# Patient Record
Sex: Male | Born: 1950 | Race: White | Hispanic: No | Marital: Single | Smoking: Former smoker
Health system: Southern US, Community
[De-identification: ages and names within clinical notes are randomized; demographics above are authoritative.]

---

## 2014-10-01 ENCOUNTER — Encounter: Payer: Self-pay | Admitting: Podiatry

## 2014-10-01 ENCOUNTER — Ambulatory Visit (INDEPENDENT_AMBULATORY_CARE_PROVIDER_SITE_OTHER): Payer: Medicaid Other | Admitting: Podiatry

## 2014-10-01 VITALS — BP 167/75 | HR 65 | Ht 67.5 in | Wt 171.0 lb

## 2014-10-01 DIAGNOSIS — M79606 Pain in leg, unspecified: Secondary | ICD-10-CM | POA: Diagnosis not present

## 2014-10-01 DIAGNOSIS — B351 Tinea unguium: Secondary | ICD-10-CM | POA: Diagnosis not present

## 2014-10-01 NOTE — Progress Notes (Signed)
Subjective: 64 year old male presents accompanied by his sister complaining of painful toe nails. Patient stated that nails are too thick and need be trimming  Denies any other problems other than hypertension, one bad kidney and COPD.   Review of Systems - General ROS: negative Psychological ROS: Slow in communication. ENT ROS: negative Allergy and Immunology ROS: negative Hematological and Lymphatic ROS: negative Respiratory ROS: Has COPD and use inhaler. Cardiovascular ROS: Takes Plavix for clots in carotid artery. Gastrointestinal ROS: no abdominal pain, change in bowel habits, or black or bloody stools Genito-Urinary ROS: no dysuria, trouble voiding, or hematuria Musculoskeletal ROS: negative Neurological ROS: no TIA or stroke symptoms Dermatological ROS: negative.  Objective: Dermatologic: Hypertrophic and dystrophic nails x 10. Vascular: All pedal pulses are faintly palpable. Neurologic: All epicritic and tactile sensations are grossly intact. Orthopedic: No gross deformities noted.  Assessment: Onychomycosis x 10. Painful feet.  Plan: All nails debrided. Return in 3 months or as needed.

## 2014-10-01 NOTE — Patient Instructions (Signed)
Seen for hypertrophic nails. All nails debrided. Return in 3 months or as needed.  

## 2015-01-02 ENCOUNTER — Encounter: Payer: Self-pay | Admitting: Podiatry

## 2015-01-02 ENCOUNTER — Ambulatory Visit (INDEPENDENT_AMBULATORY_CARE_PROVIDER_SITE_OTHER): Payer: Medicaid Other | Admitting: Podiatry

## 2015-01-02 VITALS — BP 112/68 | HR 58

## 2015-01-02 DIAGNOSIS — B351 Tinea unguium: Secondary | ICD-10-CM | POA: Diagnosis not present

## 2015-01-02 DIAGNOSIS — M79606 Pain in leg, unspecified: Secondary | ICD-10-CM | POA: Diagnosis not present

## 2015-01-02 NOTE — Progress Notes (Signed)
Subjective: 64 year old male presents complaining of painful toe nails.   Objective: Dermatologic: Hypertrophic and dystrophic nails x 10. Vascular: All pedal pulses are faintly palpable. Neurologic: All epicritic and tactile sensations are grossly intact. Orthopedic: No gross deformities noted.  Assessment: Onychomycosis x 10. Painful feet.  Plan: All nails debrided.

## 2015-01-02 NOTE — Patient Instructions (Signed)
Seen for hypertrophic nails. All nails debrided. Return in 3 months or as needed.  

## 2015-03-31 ENCOUNTER — Ambulatory Visit (INDEPENDENT_AMBULATORY_CARE_PROVIDER_SITE_OTHER): Payer: Medicaid Other | Admitting: Podiatry

## 2015-03-31 ENCOUNTER — Encounter: Payer: Self-pay | Admitting: Podiatry

## 2015-03-31 VITALS — BP 145/96 | HR 60

## 2015-03-31 DIAGNOSIS — B351 Tinea unguium: Secondary | ICD-10-CM | POA: Diagnosis not present

## 2015-03-31 DIAGNOSIS — M79606 Pain in leg, unspecified: Secondary | ICD-10-CM | POA: Diagnosis not present

## 2015-03-31 MED ORDER — CARRINGTON MOISTURE BARRIER EX CREA: TOPICAL_CREAM | CUTANEOUS | Status: DC | PRN

## 2015-03-31 NOTE — Patient Instructions (Signed)
Seen for hypertrophic nails. All nails debrided. Return in 3 months or as needed.  

## 2015-03-31 NOTE — Progress Notes (Signed)
Subjective: 64 year old male presents complaining of painful toe nails.   Past medical history reveals Heart valve surgery, COPD (Emphysema), Coronary artery disease, small and poor functioning Kidney.  Objective: Dermatologic: Hypertrophic and dystrophic nails x 10. Severe dry flaky skin. Vascular: All pedal pulses are faintly palpable. Neurologic: All epicritic and tactile sensations are grossly intact. Orthopedic: Enlarged bunion bilateral.   Assessment: Onychomycosis x 10. Xerotic skin. Painful feet.  Plan: All nails debrided. Rx sent for Eucerin cream.  Return in 3 months or as needed.

## 2015-04-03 ENCOUNTER — Ambulatory Visit: Payer: Medicaid Other | Admitting: Podiatry

## 2015-07-02 ENCOUNTER — Ambulatory Visit: Payer: Medicaid Other | Admitting: Podiatry

## 2015-07-09 ENCOUNTER — Encounter: Payer: Self-pay | Admitting: Podiatry

## 2015-07-09 ENCOUNTER — Ambulatory Visit (INDEPENDENT_AMBULATORY_CARE_PROVIDER_SITE_OTHER): Payer: Medicaid Other | Admitting: Podiatry

## 2015-07-09 VITALS — BP 140/60 | HR 72

## 2015-07-09 DIAGNOSIS — M79606 Pain in leg, unspecified: Secondary | ICD-10-CM | POA: Diagnosis not present

## 2015-07-09 DIAGNOSIS — B351 Tinea unguium: Secondary | ICD-10-CM

## 2015-07-09 NOTE — Patient Instructions (Signed)
Seen for hypertrophic nails. All nails debrided. Return in 3 months or as needed.  

## 2015-07-09 NOTE — Progress Notes (Signed)
Subjective: 65 year old male presents complaining of painful feet. Has pain under the big joint and toe nails.   Objective: Dermatologic: Hypertrophic and dystrophic nails x 10. Thick painful calluses under the first MPJ bilateral. Severe dry flaky skin. Vascular: All pedal pulses are faintly palpable. Neurologic: All epicritic and tactile sensations are grossly intact. Orthopedic: Enlarged bunion bilateral.   Assessment: Onychomycosis x 10. Xerotic skin. Painful feet.  Plan: All nails debrided. Return in 3 months or as needed

## 2015-10-06 ENCOUNTER — Ambulatory Visit: Payer: Medicaid Other | Admitting: Podiatry

## 2015-10-08 ENCOUNTER — Ambulatory Visit (INDEPENDENT_AMBULATORY_CARE_PROVIDER_SITE_OTHER): Payer: Medicare Other | Admitting: Podiatry

## 2015-10-08 ENCOUNTER — Encounter: Payer: Self-pay | Admitting: Podiatry

## 2015-10-08 VITALS — BP 135/70 | HR 60

## 2015-10-08 DIAGNOSIS — B351 Tinea unguium: Secondary | ICD-10-CM | POA: Diagnosis not present

## 2015-10-08 DIAGNOSIS — M2040 Other hammer toe(s) (acquired), unspecified foot: Secondary | ICD-10-CM | POA: Diagnosis not present

## 2015-10-08 DIAGNOSIS — Q828 Other specified congenital malformations of skin: Secondary | ICD-10-CM | POA: Diagnosis not present

## 2015-10-08 NOTE — Progress Notes (Signed)
Subjective: 65 year old male presents complaining of painful feet for thick calluses and toe nails.  Has pain under the ball of left foott and toe nails.   Objective: Dermatologic: Hypertrophic and dystrophic nails x 10. Thick painful calluses under the first MPJ bilateral. Digital corn at distal end 3rd and 4th digit left. Vascular: All pedal pulses are faintly palpable. Neurologic: All epicritic and tactile sensations are grossly intact. Orthopedic: Enlarged bunion bilateral. Contracted lesser digits with digital corn 3rd and 4th left.   Assessment: Onychomycosis x 10. Painful corns and calluses bilateral.  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  Painful feet.  Plan: All nails. Corns, and calluses debrided. Return in 3 months or as needed

## 2015-10-08 NOTE — Patient Instructions (Signed)
Seen for hypertrophic nails. All nails debrided. Return in 3 months or as needed.  

## 2015-12-10 ENCOUNTER — Other Ambulatory Visit (HOSPITAL_COMMUNITY): Payer: Self-pay | Admitting: Gastroenterology

## 2016-01-07 ENCOUNTER — Ambulatory Visit: Payer: Medicare Other | Admitting: Podiatry

## 2016-01-13 ENCOUNTER — Encounter: Payer: Self-pay | Admitting: Podiatry

## 2016-01-13 ENCOUNTER — Ambulatory Visit (INDEPENDENT_AMBULATORY_CARE_PROVIDER_SITE_OTHER): Payer: Medicare Other | Admitting: Podiatry

## 2016-01-13 DIAGNOSIS — M79673 Pain in unspecified foot: Secondary | ICD-10-CM | POA: Diagnosis not present

## 2016-01-13 DIAGNOSIS — S97102A Crushing injury of unspecified left toe(s), initial encounter: Secondary | ICD-10-CM | POA: Diagnosis not present

## 2016-01-13 DIAGNOSIS — M79606 Pain in leg, unspecified: Secondary | ICD-10-CM

## 2016-01-13 NOTE — Patient Instructions (Signed)
Seen for hypertrophic nails. All nails debrided. Keep band aid on injured toe 5th left till healed. Return in 3 months or as needed.

## 2016-01-13 NOTE — Progress Notes (Signed)
Subjective: 65 year old male presents complaining of painful feet for thick calluses and toe nails.  Bumped left foot and skin came off the little toe.  Objective: Dermatologic: Skin abrasion distal medial just proximal to nail plate. No drainage. No associated cellulitis noted. Hypertrophic and dystrophic nails x 10. Thick painful calluses under the first MPJ bilateral. Digital corn at distal end 3rd and 4th digit left. Vascular: All pedal pulses are faintly palpable. Neurologic: All epicritic and tactile sensations are grossly intact. Orthopedic: Enlarged bunion bilateral. Contracted lesser digits with digital corn 3rd and 4th left.   Assessment: Injured toe 5th left with skin abrasion.  Onychomycosis x 10. Painful corns and calluses bilateral.  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  Painful feet.  Plan: All nails. Corns, and calluses debrided. Amerigel ointment dressing applied 5th toe left. Patient is to keep a band aid till the lesion is healed.  Return in 3 months or as needed

## 2016-01-14 ENCOUNTER — Ambulatory Visit: Payer: Medicare Other | Admitting: Podiatry

## 2016-01-21 ENCOUNTER — Ambulatory Visit (HOSPITAL_COMMUNITY): Admission: RE | Admit: 2016-01-21 | Payer: Medicare Other | Source: Ambulatory Visit

## 2016-03-10 ENCOUNTER — Ambulatory Visit (HOSPITAL_COMMUNITY)
Admission: RE | Admit: 2016-03-10 | Discharge: 2016-03-10 | Disposition: A | Discharge status: Home / Self Care | Payer: Medicare Other | Source: Ambulatory Visit | Attending: Gastroenterology | Admitting: Gastroenterology

## 2016-03-10 DIAGNOSIS — N261 Atrophy of kidney (terminal): Secondary | ICD-10-CM | POA: Diagnosis not present

## 2016-04-21 ENCOUNTER — Encounter: Payer: Self-pay | Admitting: Podiatry

## 2016-04-21 ENCOUNTER — Ambulatory Visit (INDEPENDENT_AMBULATORY_CARE_PROVIDER_SITE_OTHER): Payer: Medicare Other | Admitting: Podiatry

## 2016-04-21 VITALS — BP 163/82 | HR 62

## 2016-04-21 DIAGNOSIS — B351 Tinea unguium: Secondary | ICD-10-CM

## 2016-04-21 DIAGNOSIS — M79672 Pain in left foot: Secondary | ICD-10-CM | POA: Diagnosis not present

## 2016-04-21 DIAGNOSIS — M79671 Pain in right foot: Secondary | ICD-10-CM

## 2016-04-21 NOTE — Patient Instructions (Signed)
Seen for hypertrophic nails. All nails debrided. Return in 3 months or as needed.  

## 2016-04-21 NOTE — Progress Notes (Signed)
Subjective: 65 year old male presents complaining of painful feet from calluses and toe nails.   Objective: Dermatologic:Hypertrophic and dystrophic nails x 10. Thick painful calluses under the first MPJ bilateral. Digital corn at distal end 3rd and 4th digit left. Vascular: All pedal pulses are faintly palpable. Neurologic: All epicritic and tactile sensations are grossly intact. Orthopedic: Enlarged bunion bilateral. Contracted lesser digits with digital corn 3rd and 4th left.   Assessment: Onychomycosis x 10. Painful corns and calluses bilateral.  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  Painful feet.  Plan: All nails. Corns, and calluses debrided. Return in 3 months or as needed

## 2016-07-21 ENCOUNTER — Ambulatory Visit: Payer: Medicare Other | Admitting: Podiatry

## 2016-07-28 ENCOUNTER — Ambulatory Visit: Payer: Medicare Other | Admitting: Podiatry

## 2016-08-11 ENCOUNTER — Ambulatory Visit (INDEPENDENT_AMBULATORY_CARE_PROVIDER_SITE_OTHER): Payer: Medicare Other | Admitting: Podiatry

## 2016-08-11 DIAGNOSIS — M79671 Pain in right foot: Secondary | ICD-10-CM | POA: Diagnosis not present

## 2016-08-11 DIAGNOSIS — M79672 Pain in left foot: Secondary | ICD-10-CM

## 2016-08-11 DIAGNOSIS — B351 Tinea unguium: Secondary | ICD-10-CM | POA: Diagnosis not present

## 2016-08-11 NOTE — Patient Instructions (Signed)
Seen for hypertrophic nails. All nails debrided. Return in 3 months or as needed.  

## 2016-08-11 NOTE — Progress Notes (Signed)
Subjective: 66 year old male presents complaining of painful feet from calluses and toe nails.   Objective: Dermatologic:Hypertrophic and dystrophic nails x 10. Thick painful calluses under the first MPJ bilateral. Digital corn at distal end 3rd and 4th digit left. Vascular: All pedal pulses are faintly palpable. Neurologic: All epicritic and tactile sensations are grossly intact. Orthopedic: Enlarged bunion bilateral. Contracted lesser digits with digital corn 3rd and 4th left.   Assessment: Onychomycosis x 10. Painful corns and calluses bilateral.  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  Painful feet.  Plan: All nails. Corns, and calluses debrided. Return in 3 months or as needed

## 2016-08-12 ENCOUNTER — Encounter: Payer: Self-pay | Admitting: Podiatry

## 2016-11-10 ENCOUNTER — Ambulatory Visit (INDEPENDENT_AMBULATORY_CARE_PROVIDER_SITE_OTHER): Payer: Medicare Other | Admitting: Podiatry

## 2016-11-10 ENCOUNTER — Encounter: Payer: Self-pay | Admitting: Podiatry

## 2016-11-10 VITALS — BP 135/65 | HR 61

## 2016-11-10 DIAGNOSIS — M79672 Pain in left foot: Secondary | ICD-10-CM | POA: Diagnosis not present

## 2016-11-10 DIAGNOSIS — M79671 Pain in right foot: Secondary | ICD-10-CM

## 2016-11-10 DIAGNOSIS — B351 Tinea unguium: Secondary | ICD-10-CM

## 2016-11-10 NOTE — Patient Instructions (Signed)
Seen for hypertrophic nails. All nails debrided. Return in 3 months or as needed.  

## 2016-11-10 NOTE — Progress Notes (Signed)
Subjective: 66 year old male presents requesting painful calluses and toe nails trimmed.   Objective: Dermatologic:Hypertrophic and dystrophic nails x 10. Thick painful calluses under the first MPJ bilateral. Digital corn at distal end 3rd and 4th digit left. Vascular: All pedal pulses are faintly palpable. Neurologic: All epicritic and tactile sensations are grossly intact. Orthopedic: Enlarged bunion bilateral. Contracted lesser digits with digital corn 3rd and 4th left.   Assessment: Onychomycosis x 10. Painful corns and calluses bilateral.  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  Painful feet.  Plan: All nails. Corns, and calluses debrided. Return in 3 months or as needed

## 2017-02-07 ENCOUNTER — Ambulatory Visit (INDEPENDENT_AMBULATORY_CARE_PROVIDER_SITE_OTHER): Payer: Medicare Other | Admitting: Podiatry

## 2017-02-07 ENCOUNTER — Encounter: Payer: Self-pay | Admitting: Podiatry

## 2017-02-07 DIAGNOSIS — B351 Tinea unguium: Secondary | ICD-10-CM | POA: Diagnosis not present

## 2017-02-07 DIAGNOSIS — M79604 Pain in right leg: Secondary | ICD-10-CM | POA: Diagnosis not present

## 2017-02-07 DIAGNOSIS — M79605 Pain in left leg: Secondary | ICD-10-CM

## 2017-02-07 NOTE — Patient Instructions (Signed)
Seen for hypertrophic nails. All nails debrided. Return in 3 months or as needed.  

## 2017-02-07 NOTE — Progress Notes (Signed)
Subjective: 66year old male presents complaining of painful nails and requesting toe nails trimmed.   Objective: Dermatologic:Hypertrophic and dystrophic nails x 10. No abnormal skin lesions noted. Vascular: All pedal pulses are faintly palpable. Neurologic: All epicritic and tactile sensations are grossly intact. Orthopedic: Enlarged bunion bilateral. Contracted lesser digits with digital corn 3rd and 4th left.   Assessment: Onychomycosis x 10. 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  Painful feet.  Plan: All nails debrided. Return as needed

## 2017-02-09 ENCOUNTER — Ambulatory Visit: Payer: Medicare Other | Admitting: Podiatry

## 2017-05-04 ENCOUNTER — Ambulatory Visit: Payer: Medicare Other | Admitting: Podiatry

## 2017-05-04 ENCOUNTER — Ambulatory Visit (INDEPENDENT_AMBULATORY_CARE_PROVIDER_SITE_OTHER): Payer: Medicare Other | Admitting: Podiatry

## 2017-05-04 DIAGNOSIS — M79605 Pain in left leg: Secondary | ICD-10-CM

## 2017-05-04 DIAGNOSIS — Q828 Other specified congenital malformations of skin: Secondary | ICD-10-CM | POA: Diagnosis not present

## 2017-05-04 DIAGNOSIS — M2040 Other hammer toe(s) (acquired), unspecified foot: Secondary | ICD-10-CM | POA: Diagnosis not present

## 2017-05-04 DIAGNOSIS — B351 Tinea unguium: Secondary | ICD-10-CM

## 2017-05-04 DIAGNOSIS — M79604 Pain in right leg: Secondary | ICD-10-CM | POA: Diagnosis not present

## 2017-05-04 NOTE — Patient Instructions (Signed)
Seen for pre ulcerative callus and hypertrophic nails. All callused lesions and nails debrided. Return in 3 months or as needed.

## 2017-05-05 ENCOUNTER — Encounter: Payer: Self-pay | Admitting: Podiatry

## 2017-05-05 NOTE — Progress Notes (Signed)
Subjective: 66 y.o. year old male patient presents accompanied by his wife complaining of painful feet. Stated that he was seen at PCP office and was advised to have calluses trimmed ASAP for they appear to be pre ulcerative.  Objective: Dermatologic: Thick yellow deformed nails x 10. Severe plantar callus under the first MPJ bilateral L>R. Mild intradermal noted. No open skin lesions or associated edema or erythema noted. Distal clivi under 3rd and 4th digit left. Vascular: Pedal pulses are not palpable on both DP and PT.  Orthopedic: Severe bunion deformity with contracted lesser diits 3rd and 4th left. Neurologic: All epicritic and tactile sensations grossly intact.  Assessment: Dystrophic mycotic nails x 10. Pre ulcerative callus sub first MPJ bilateral L>R. Hammer toe deformity 3rd and 4th with digital callus left. Peripheral vascular insufficiency.  Pain in both feet.  Treatment: All mycotic nails, corns, calluses debrided.  Return in 3 months or sooner if feet become painful.

## 2017-05-11 ENCOUNTER — Ambulatory Visit: Payer: Medicare Other | Admitting: Podiatry

## 2017-08-03 ENCOUNTER — Ambulatory Visit (INDEPENDENT_AMBULATORY_CARE_PROVIDER_SITE_OTHER): Payer: Medicare Other | Admitting: Podiatry

## 2017-08-03 ENCOUNTER — Encounter: Payer: Self-pay | Admitting: Podiatry

## 2017-08-03 DIAGNOSIS — Q828 Other specified congenital malformations of skin: Secondary | ICD-10-CM

## 2017-08-03 DIAGNOSIS — M79605 Pain in left leg: Secondary | ICD-10-CM

## 2017-08-03 DIAGNOSIS — M79672 Pain in left foot: Secondary | ICD-10-CM

## 2017-08-03 DIAGNOSIS — B351 Tinea unguium: Secondary | ICD-10-CM

## 2017-08-03 DIAGNOSIS — M79604 Pain in right leg: Secondary | ICD-10-CM

## 2017-08-03 DIAGNOSIS — I739 Peripheral vascular disease, unspecified: Secondary | ICD-10-CM | POA: Diagnosis not present

## 2017-08-03 DIAGNOSIS — M79671 Pain in right foot: Secondary | ICD-10-CM

## 2017-08-03 NOTE — Progress Notes (Signed)
Subjective: 67 y.o. year old male patient presents accompanied by his sister complaining of painful nails. Patient requests toe nails, corns and calluses trimmed.   Objective: Dermatologic: Thick yellow deformed nails x 10. Plantar calluses under 1st MPJ bilateral. Vascular: Pedal pulses are notl palpable both DP and PT. Orthopedic: Contracted lesser digits 3rd and 4th left with bunion deformity. Neurologic: All epicritic and tactile sensations grossly intact.  Assessment: Dystrophic mycotic nails x 10. Plantar callus under first MPJ bilateral. PVD.  Treatment: All mycotic nails, corns, calluses debrided.  Return in 3 months or as needed.

## 2017-08-03 NOTE — Patient Instructions (Signed)
Seen for hypertrophic nails and calluses. All nails and calluses debrided. Return in 3 months or as needed.  

## 2017-08-06 IMAGING — US ABDOMEN
1 series · 13 of 15 positions shown · non-contrast
Comparison: None.

CLINICAL DATA: Patient with history of hemochromatosis.



[Series 1: abdomen · 0.15mm/px · 13 of 15 slices shown]
[im 1/15]
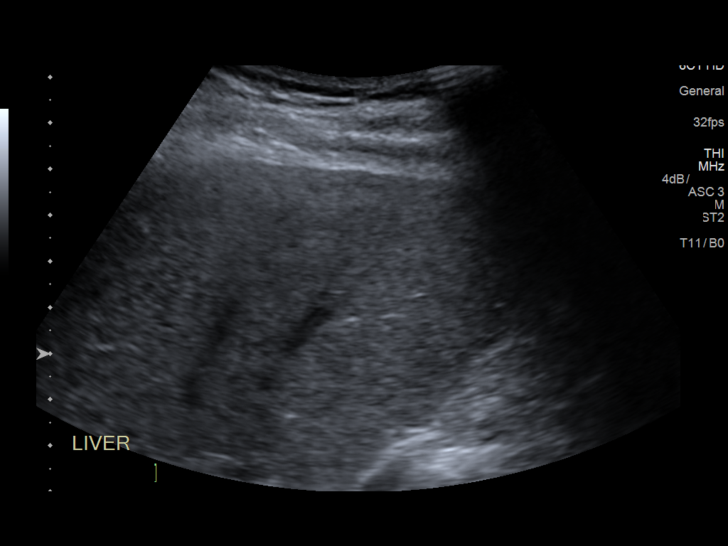
[im 2/15]
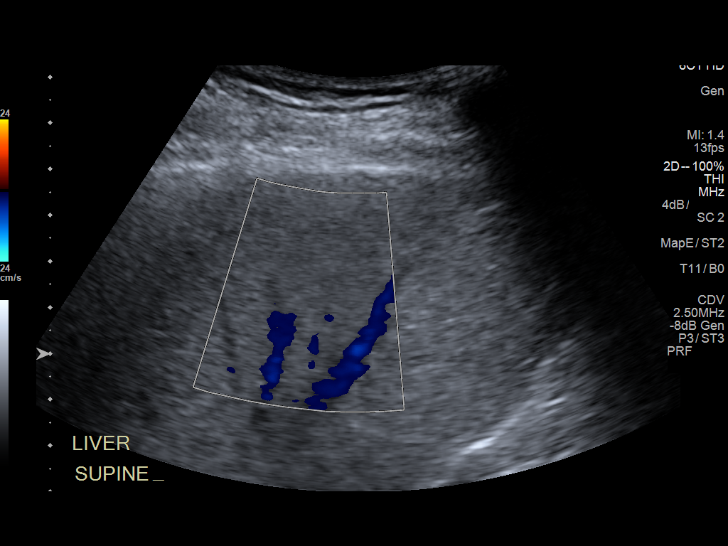
[im 3/15]
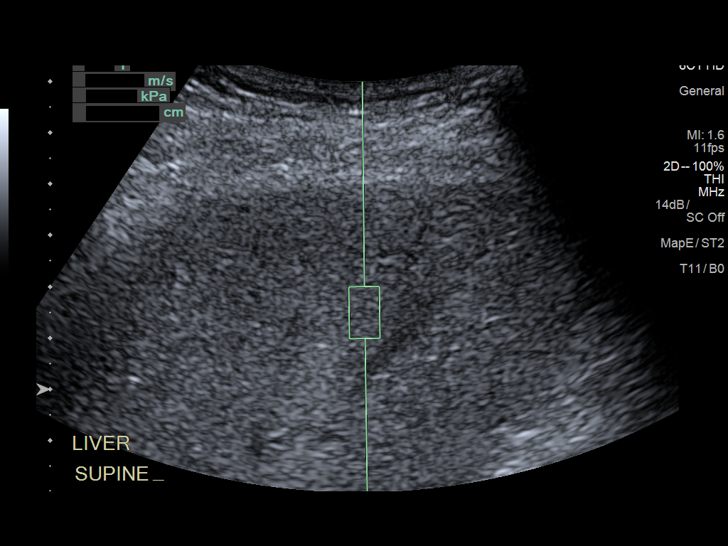
[im 5/15]
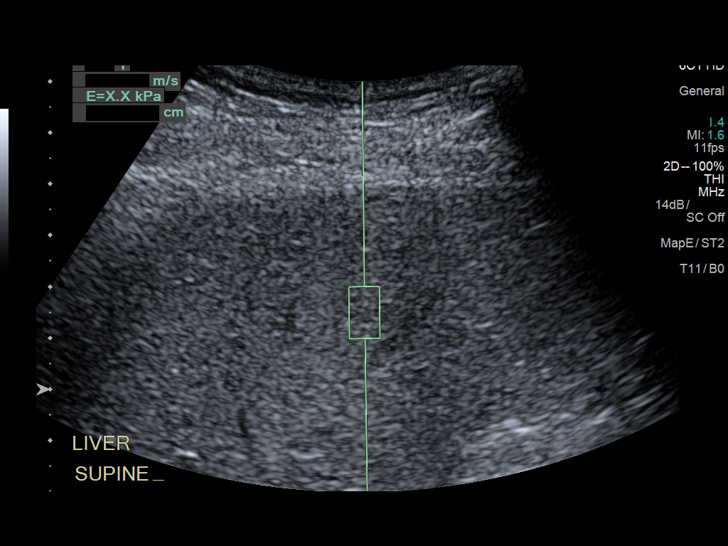
[im 6/15]
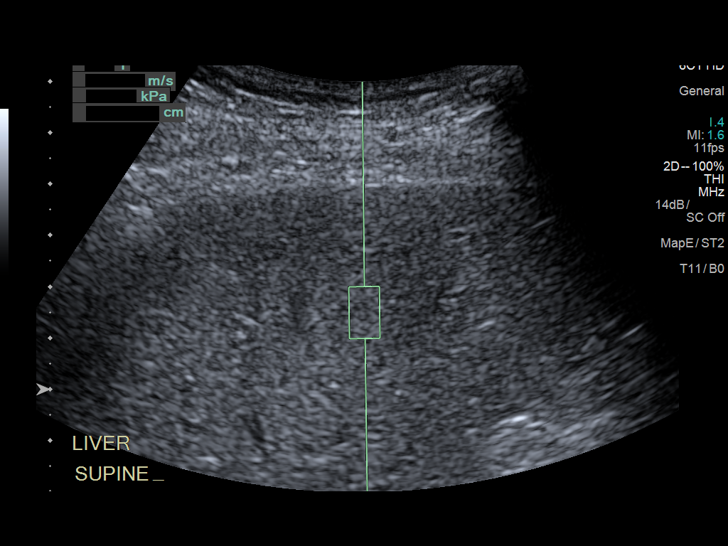
[im 7/15]
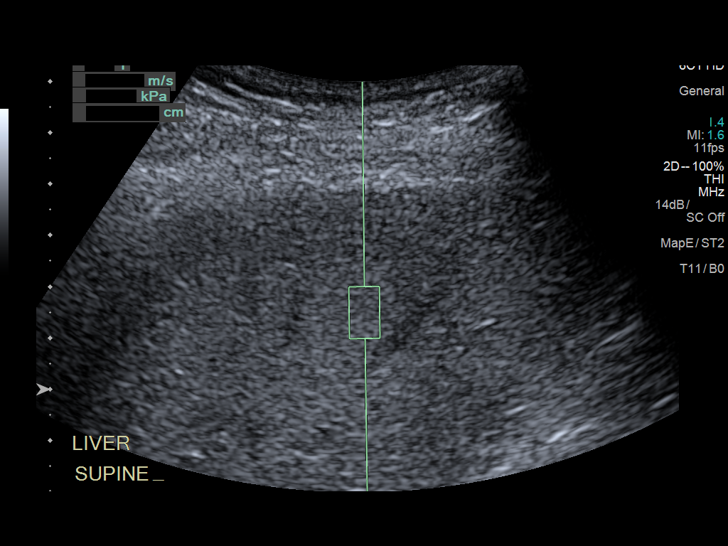
[im 8/15]
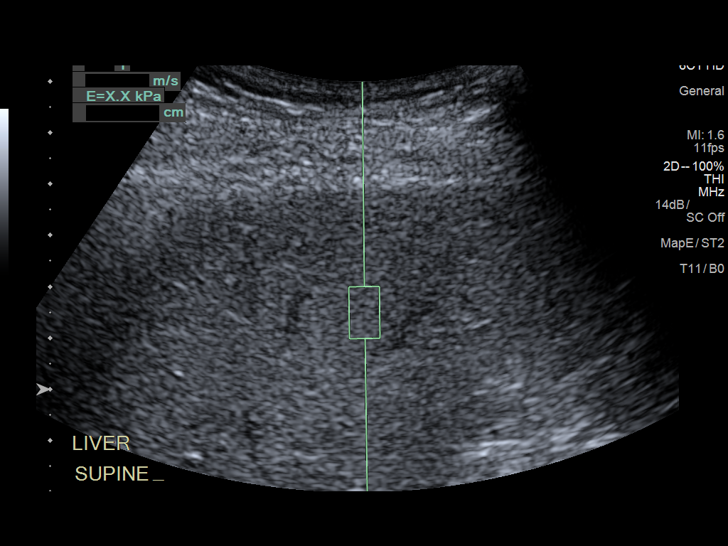
[im 9/15]
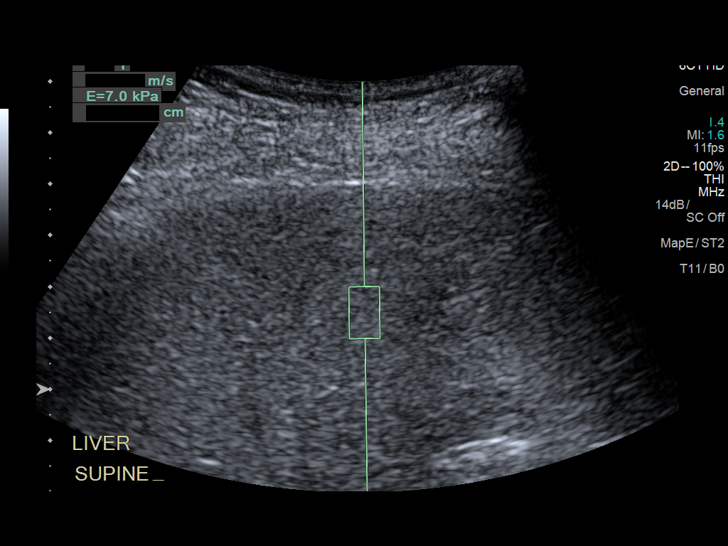
[im 10/15]
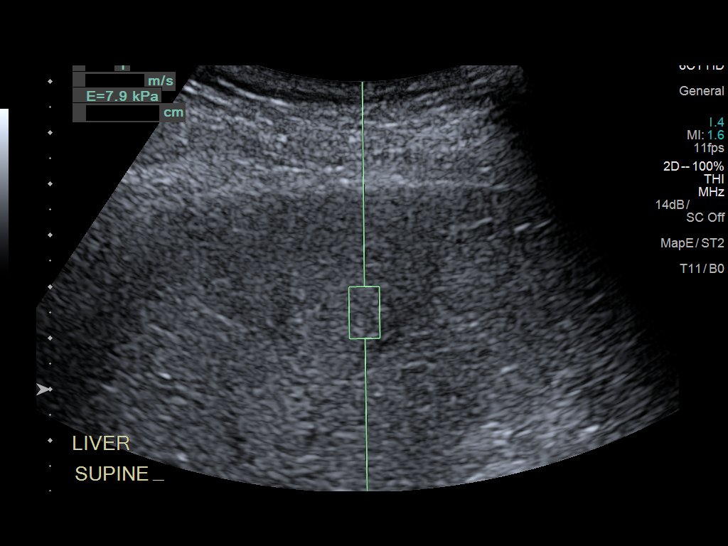
[im 11/15]
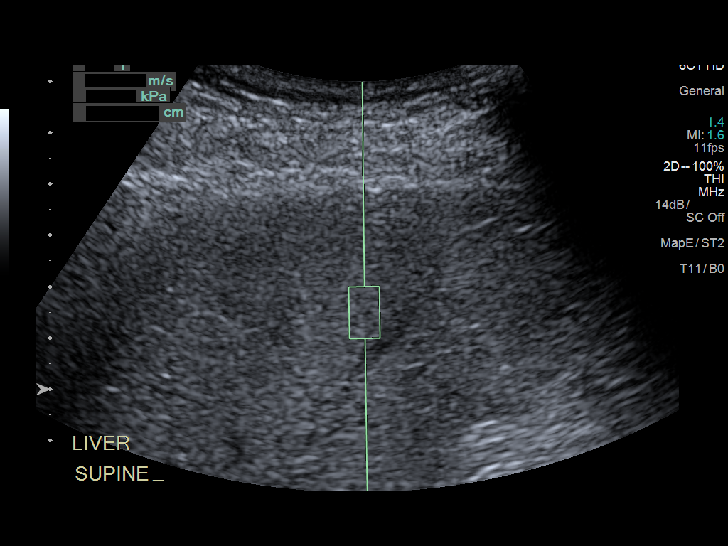
[im 13/15]
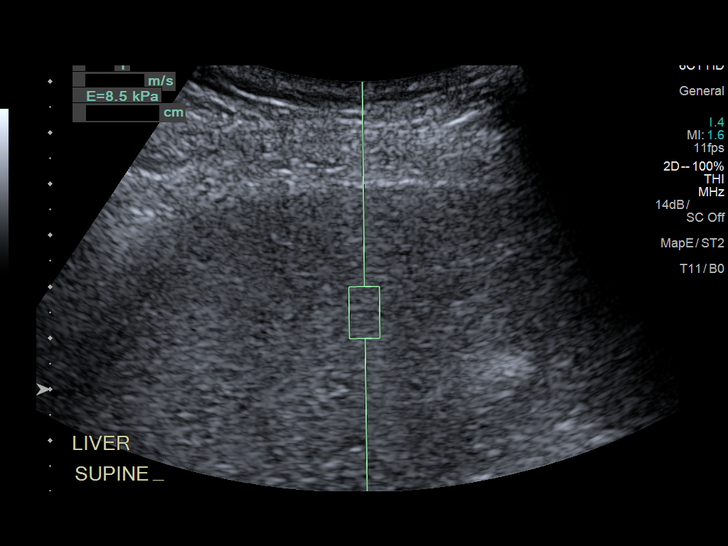
[im 14/15]
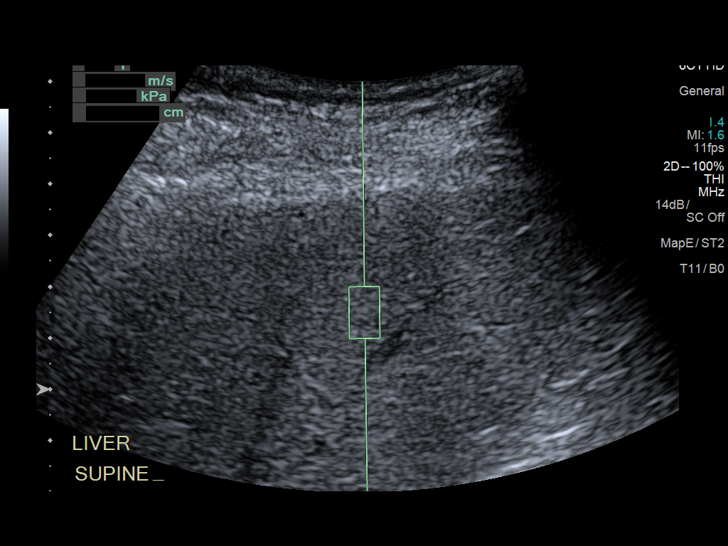
[im 15/15]
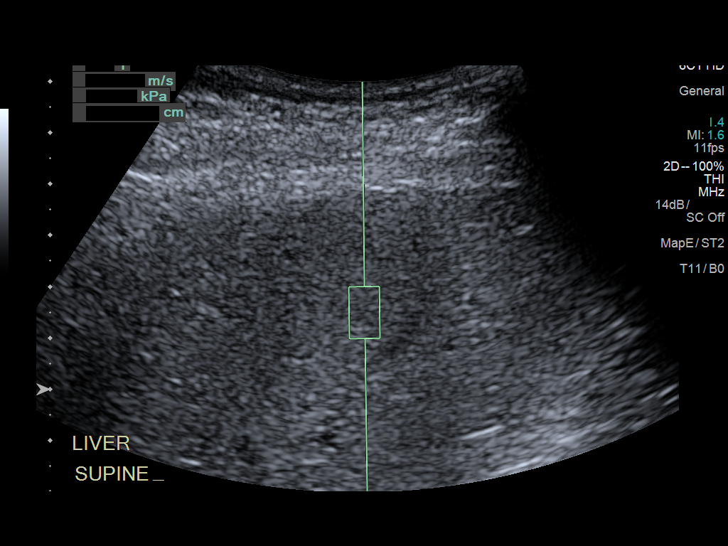

[13 of 15 positions shown; findings below may reference images not displayed]

FINDINGS: ULTRASOUND ABDOMEN

Gallbladder: No gallstones or wall thickening visualized. No
sonographic Murphy sign noted by sonographer.

Common bile duct: Diameter: 4.6 mm

Liver: No focal lesion identified. Within normal limits in
parenchymal echogenicity.

IVC: No abnormality visualized.

Pancreas: Visualized portion unremarkable.

Spleen: Size and appearance within normal limits.

Right Kidney: Length: 6.2 cm. The right kidney is atrophic with
renal cortical thinning. No hydronephrosis.

Left Kidney: Length: 12.5 cm. There is a 1.2 x 1.0 x 1.0 cm cyst. No
hydronephrosis.

Abdominal aorta: No aneurysm visualized.

Other findings: None.

ULTRASOUND HEPATIC ELASTOGRAPHY

Device: Siemens Helix VTQ

Patient position: Supine

Transducer [id number]

Number of measurements: 10

Hepatic segment:  8

Median velocity:   2.23  m/sec

IQR:

IQR/Median velocity ratio:

Corresponding Metavir fibrosis score:  Some F3 + F4

Risk of fibrosis: High

Limitations of exam: None

Pertinent findings noted on other imaging exams:  None

Please note that abnormal shear wave velocities may also be
identified in clinical settings other than with hepatic fibrosis,
such as: acute hepatitis, elevated right heart and central venous
pressures including use of beta blockers, Ernestina disease
(Erxleben), infiltrative processes such as
mastocytosis/amyloidosis/infiltrative tumor, extrahepatic
cholestasis, in the post-prandial state, and liver transplantation.
Correlation with patient history, laboratory data, and clinical
condition recommended.
IMPRESSION: ULTRASOUND ABDOMEN:
Atrophic right kidney.

ULTRASOUND HEPATIC ELASTOGRAPHY:

Median hepatic shear wave velocity is calculated at 2.23 m/sec.

Corresponding Metavir fibrosis score is  Some F3 + F4.

Risk of fibrosis is High.

Follow-up: Follow up advised

## 2017-11-09 ENCOUNTER — Ambulatory Visit (INDEPENDENT_AMBULATORY_CARE_PROVIDER_SITE_OTHER): Payer: Medicare Other | Admitting: Podiatry

## 2017-11-09 ENCOUNTER — Encounter: Payer: Self-pay | Admitting: Podiatry

## 2017-11-09 DIAGNOSIS — M2040 Other hammer toe(s) (acquired), unspecified foot: Secondary | ICD-10-CM

## 2017-11-09 DIAGNOSIS — B351 Tinea unguium: Secondary | ICD-10-CM

## 2017-11-09 DIAGNOSIS — M79604 Pain in right leg: Secondary | ICD-10-CM

## 2017-11-09 DIAGNOSIS — L97521 Non-pressure chronic ulcer of other part of left foot limited to breakdown of skin: Secondary | ICD-10-CM

## 2017-11-09 DIAGNOSIS — M79605 Pain in left leg: Secondary | ICD-10-CM

## 2017-11-09 DIAGNOSIS — I739 Peripheral vascular disease, unspecified: Secondary | ICD-10-CM | POA: Diagnosis not present

## 2017-11-09 NOTE — Patient Instructions (Signed)
Seen for hypertrophic nails. Noted of ulcerating corns and calluses. All nails and ulcerative lesions debrided. Return in 1 month.

## 2017-11-09 NOTE — Progress Notes (Signed)
Subjective: 67 y.o. year old male patient presents for routine foot care. Stated that his left foot has been hurting. He is accompanied by his sister.   Objective: Dermatologic: Thick yellow deformed nails x 10.  Plantar calluses under 1st MPJ bilateral with intradermal bleeding under the first MPJ left.  Ulcerating corn 3rd digit left. No drainage noted. Base of ulcer is dry with intradermal bleeding. Vascular: Pedal pulses are notl palpable both DP and PT. Orthopedic: Contracted lesser digits 3rd and 4th left with bunion deformity. Neurologic: All epicritic and tactile sensations grossly intact.  Assessment: Dystrophic mycotic nails x 10. Plantar callus under first MPJ bilateral. Digital corn 3rd left. Ulcerating skin lesions limited to breakdown of skin, sub 1 left, 3rd toe left. PVD.  Treatment: All mycotic nails, corns, calluses debrided.  3rd digit left dressed with Amerigel ointment. Instructed to return in 1 month.

## 2017-12-14 ENCOUNTER — Ambulatory Visit: Payer: Medicare Other | Admitting: Podiatry

## 2017-12-28 ENCOUNTER — Ambulatory Visit: Payer: Medicare Other | Admitting: Podiatry

## 2018-01-04 ENCOUNTER — Ambulatory Visit (INDEPENDENT_AMBULATORY_CARE_PROVIDER_SITE_OTHER): Payer: Medicare Other | Admitting: Podiatry

## 2018-01-04 ENCOUNTER — Encounter: Payer: Self-pay | Admitting: Podiatry

## 2018-01-04 DIAGNOSIS — I739 Peripheral vascular disease, unspecified: Secondary | ICD-10-CM

## 2018-01-04 DIAGNOSIS — L97511 Non-pressure chronic ulcer of other part of right foot limited to breakdown of skin: Secondary | ICD-10-CM

## 2018-01-04 NOTE — Progress Notes (Signed)
Subjective: 67 y.o. year old male patient presents complaining of painful sore on bottom of both feet duration of over a month.  Was told to come in one month, July but could not make that appointment.  Objective: Dermatologic: Thick yellow deformed nails x 10. Ulcerating callus under left great toe and first MPJ plantar right foot with intradermal bleeding. No active drainage noted. Thin shiny skin. Vascular: Pedal pulses are notl palpable. Orthopedic: Contracted lesser digits bilateral. Bunion deformity L>R. Neurologic: All epicritic and tactile sensations grossly intact.  Assessment: Dystrophic mycotic nails x 10. Hammer toe deformities. Ulcerating callus left great toe, under first MPJ right, intradermal bleeding. PVD  Treatment: Debrided all ulcerating callus and toe nails. Amerigel ointment dressing applied to left great toe. Instructed to keep the left great toe lesion covered during the day. Return in 2 month.

## 2018-01-04 NOTE — Patient Instructions (Signed)
Seen for painful feet. Noted of ulcerating callus left great toe and under the right big toe area. All nails are hypertrophic. Pre ulcerative plantar lesions debrided and Amerigel ointment dressing applied to left great toe. All nails debrided. Cover the left great toe ulcerating area with Band aid during the day. Return in 3 months or sooner if skin breakdown.

## 2018-03-15 ENCOUNTER — Ambulatory Visit (INDEPENDENT_AMBULATORY_CARE_PROVIDER_SITE_OTHER): Payer: Medicare Other | Admitting: Podiatry

## 2018-03-15 ENCOUNTER — Encounter: Payer: Self-pay | Admitting: Podiatry

## 2018-03-15 DIAGNOSIS — I739 Peripheral vascular disease, unspecified: Secondary | ICD-10-CM | POA: Diagnosis not present

## 2018-03-15 DIAGNOSIS — B351 Tinea unguium: Secondary | ICD-10-CM | POA: Diagnosis not present

## 2018-03-15 DIAGNOSIS — Q828 Other specified congenital malformations of skin: Secondary | ICD-10-CM

## 2018-03-15 NOTE — Patient Instructions (Signed)
Pre ulcerative callus and toe nails debrided.

## 2018-03-15 NOTE — Progress Notes (Signed)
Subjective: 67 y.o. year old male patient presents complaining of painful sore on the left great toe.  Objective: Dermatologic: Thick yellow deformed nails x 10. Pre ulcerating callus under left great toe and first MPJ plantar right foot with intradermal bleeding.  No active drainage noted. Thin shiny skin. Vascular: Pedal pulses are notl palpable. Orthopedic: Contracted lesser digits bilateral. Bunion deformity L>R. Neurologic: All epicritic and tactile sensations grossly intact.  Assessment: Dystrophic mycotic nails x 10. Hammer toe deformities. Pre ulcerative callus left great toe, under first MPJ right, intradermal bleeding. PVD  Treatment: Debrided all calluses and toe nails.

## 2023-08-09 ENCOUNTER — Inpatient Hospital Stay (HOSPITAL_COMMUNITY)
Admission: EM | Admit: 2023-08-09 | Discharge: 2023-09-21 | DRG: 193 | Disposition: E | Source: Skilled Nursing Facility | Attending: Internal Medicine | Admitting: Internal Medicine

## 2023-08-09 ENCOUNTER — Emergency Department (HOSPITAL_COMMUNITY)

## 2023-08-09 ENCOUNTER — Other Ambulatory Visit: Payer: Self-pay

## 2023-08-09 ENCOUNTER — Inpatient Hospital Stay (HOSPITAL_COMMUNITY)

## 2023-08-09 ENCOUNTER — Encounter (HOSPITAL_COMMUNITY): Payer: Self-pay

## 2023-08-09 DIAGNOSIS — E43 Unspecified severe protein-calorie malnutrition: Secondary | ICD-10-CM | POA: Diagnosis present

## 2023-08-09 DIAGNOSIS — Z87891 Personal history of nicotine dependence: Secondary | ICD-10-CM

## 2023-08-09 DIAGNOSIS — Z515 Encounter for palliative care: Secondary | ICD-10-CM

## 2023-08-09 DIAGNOSIS — I5021 Acute systolic (congestive) heart failure: Secondary | ICD-10-CM | POA: Diagnosis present

## 2023-08-09 DIAGNOSIS — I480 Paroxysmal atrial fibrillation: Secondary | ICD-10-CM | POA: Diagnosis present

## 2023-08-09 DIAGNOSIS — Z7902 Long term (current) use of antithrombotics/antiplatelets: Secondary | ICD-10-CM

## 2023-08-09 DIAGNOSIS — J159 Unspecified bacterial pneumonia: Principal | ICD-10-CM | POA: Diagnosis present

## 2023-08-09 DIAGNOSIS — Z7951 Long term (current) use of inhaled steroids: Secondary | ICD-10-CM

## 2023-08-09 DIAGNOSIS — R64 Cachexia: Secondary | ICD-10-CM | POA: Diagnosis present

## 2023-08-09 DIAGNOSIS — R338 Other retention of urine: Secondary | ICD-10-CM | POA: Diagnosis present

## 2023-08-09 DIAGNOSIS — N179 Acute kidney failure, unspecified: Secondary | ICD-10-CM | POA: Diagnosis present

## 2023-08-09 DIAGNOSIS — I255 Ischemic cardiomyopathy: Secondary | ICD-10-CM | POA: Diagnosis present

## 2023-08-09 DIAGNOSIS — I252 Old myocardial infarction: Secondary | ICD-10-CM

## 2023-08-09 DIAGNOSIS — N401 Enlarged prostate with lower urinary tract symptoms: Secondary | ICD-10-CM | POA: Diagnosis present

## 2023-08-09 DIAGNOSIS — J440 Chronic obstructive pulmonary disease with acute lower respiratory infection: Secondary | ICD-10-CM | POA: Diagnosis present

## 2023-08-09 DIAGNOSIS — Z681 Body mass index (BMI) 19 or less, adult: Secondary | ICD-10-CM

## 2023-08-09 DIAGNOSIS — R55 Syncope and collapse: Secondary | ICD-10-CM | POA: Diagnosis present

## 2023-08-09 DIAGNOSIS — G9341 Metabolic encephalopathy: Secondary | ICD-10-CM | POA: Diagnosis present

## 2023-08-09 DIAGNOSIS — K828 Other specified diseases of gallbladder: Secondary | ICD-10-CM | POA: Diagnosis present

## 2023-08-09 DIAGNOSIS — E870 Hyperosmolality and hypernatremia: Secondary | ICD-10-CM | POA: Diagnosis present

## 2023-08-09 DIAGNOSIS — E860 Dehydration: Secondary | ICD-10-CM | POA: Diagnosis present

## 2023-08-09 DIAGNOSIS — J439 Emphysema, unspecified: Secondary | ICD-10-CM | POA: Diagnosis present

## 2023-08-09 DIAGNOSIS — J9601 Acute respiratory failure with hypoxia: Principal | ICD-10-CM | POA: Diagnosis present

## 2023-08-09 DIAGNOSIS — W19XXXA Unspecified fall, initial encounter: Secondary | ICD-10-CM | POA: Diagnosis present

## 2023-08-09 DIAGNOSIS — M75100 Unspecified rotator cuff tear or rupture of unspecified shoulder, not specified as traumatic: Secondary | ICD-10-CM | POA: Diagnosis present

## 2023-08-09 DIAGNOSIS — N39490 Overflow incontinence: Secondary | ICD-10-CM | POA: Diagnosis present

## 2023-08-09 DIAGNOSIS — I110 Hypertensive heart disease with heart failure: Secondary | ICD-10-CM | POA: Diagnosis present

## 2023-08-09 DIAGNOSIS — Z951 Presence of aortocoronary bypass graft: Secondary | ICD-10-CM

## 2023-08-09 DIAGNOSIS — I160 Hypertensive urgency: Secondary | ICD-10-CM | POA: Diagnosis present

## 2023-08-09 DIAGNOSIS — Z79899 Other long term (current) drug therapy: Secondary | ICD-10-CM

## 2023-08-09 DIAGNOSIS — F05 Delirium due to known physiological condition: Secondary | ICD-10-CM | POA: Diagnosis not present

## 2023-08-09 DIAGNOSIS — J441 Chronic obstructive pulmonary disease with (acute) exacerbation: Secondary | ICD-10-CM | POA: Diagnosis present

## 2023-08-09 DIAGNOSIS — R4182 Altered mental status, unspecified: Secondary | ICD-10-CM

## 2023-08-09 DIAGNOSIS — I739 Peripheral vascular disease, unspecified: Secondary | ICD-10-CM | POA: Diagnosis present

## 2023-08-09 DIAGNOSIS — R410 Disorientation, unspecified: Secondary | ICD-10-CM | POA: Diagnosis not present

## 2023-08-09 DIAGNOSIS — Z8673 Personal history of transient ischemic attack (TIA), and cerebral infarction without residual deficits: Secondary | ICD-10-CM

## 2023-08-09 DIAGNOSIS — E872 Acidosis, unspecified: Secondary | ICD-10-CM | POA: Diagnosis present

## 2023-08-09 DIAGNOSIS — I358 Other nonrheumatic aortic valve disorders: Secondary | ICD-10-CM | POA: Diagnosis present

## 2023-08-09 DIAGNOSIS — Z886 Allergy status to analgesic agent status: Secondary | ICD-10-CM

## 2023-08-09 DIAGNOSIS — K7460 Unspecified cirrhosis of liver: Secondary | ICD-10-CM | POA: Diagnosis present

## 2023-08-09 DIAGNOSIS — I4510 Unspecified right bundle-branch block: Secondary | ICD-10-CM | POA: Diagnosis present

## 2023-08-09 DIAGNOSIS — Z8551 Personal history of malignant neoplasm of bladder: Secondary | ICD-10-CM

## 2023-08-09 DIAGNOSIS — Z66 Do not resuscitate: Secondary | ICD-10-CM | POA: Diagnosis not present

## 2023-08-09 DIAGNOSIS — R000 Tachycardia, unspecified: Secondary | ICD-10-CM | POA: Diagnosis not present

## 2023-08-09 DIAGNOSIS — Z880 Allergy status to penicillin: Secondary | ICD-10-CM

## 2023-08-09 DIAGNOSIS — I2510 Atherosclerotic heart disease of native coronary artery without angina pectoris: Secondary | ICD-10-CM | POA: Diagnosis present

## 2023-08-09 DIAGNOSIS — Z6820 Body mass index (BMI) 20.0-20.9, adult: Secondary | ICD-10-CM

## 2023-08-09 DIAGNOSIS — J189 Pneumonia, unspecified organism: Secondary | ICD-10-CM | POA: Diagnosis not present

## 2023-08-09 DIAGNOSIS — F0394 Unspecified dementia, unspecified severity, with anxiety: Secondary | ICD-10-CM | POA: Diagnosis present

## 2023-08-09 DIAGNOSIS — Z789 Other specified health status: Secondary | ICD-10-CM | POA: Diagnosis not present

## 2023-08-09 DIAGNOSIS — R9431 Abnormal electrocardiogram [ECG] [EKG]: Secondary | ICD-10-CM | POA: Diagnosis present

## 2023-08-09 DIAGNOSIS — I509 Heart failure, unspecified: Secondary | ICD-10-CM

## 2023-08-09 DIAGNOSIS — Z7189 Other specified counseling: Secondary | ICD-10-CM | POA: Diagnosis not present

## 2023-08-09 DIAGNOSIS — M6282 Rhabdomyolysis: Secondary | ICD-10-CM | POA: Diagnosis present

## 2023-08-09 DIAGNOSIS — I081 Rheumatic disorders of both mitral and tricuspid valves: Secondary | ICD-10-CM | POA: Diagnosis present

## 2023-08-09 DIAGNOSIS — Z7901 Long term (current) use of anticoagulants: Secondary | ICD-10-CM

## 2023-08-09 DIAGNOSIS — Z7409 Other reduced mobility: Secondary | ICD-10-CM | POA: Diagnosis present

## 2023-08-09 DIAGNOSIS — M19012 Primary osteoarthritis, left shoulder: Secondary | ICD-10-CM | POA: Diagnosis present

## 2023-08-09 DIAGNOSIS — E785 Hyperlipidemia, unspecified: Secondary | ICD-10-CM | POA: Diagnosis present

## 2023-08-09 LAB — RESP PANEL BY RT-PCR (RSV, FLU A&B, COVID)  RVPGX2
Influenza A by PCR: NEGATIVE
Influenza B by PCR: NEGATIVE
Resp Syncytial Virus by PCR: NEGATIVE
SARS Coronavirus 2 by RT PCR: NEGATIVE

## 2023-08-09 LAB — COMPREHENSIVE METABOLIC PANEL
ALT: 22 U/L (ref 0–44)
AST: 42 U/L — ABNORMAL HIGH (ref 15–41)
Albumin: 3.3 g/dL — ABNORMAL LOW (ref 3.5–5.0)
Alkaline Phosphatase: 95 U/L (ref 38–126)
Anion gap: 10 (ref 5–15)
BUN: 23 mg/dL (ref 8–23)
CO2: 22 mmol/L (ref 22–32)
Calcium: 9.3 mg/dL (ref 8.9–10.3)
Chloride: 107 mmol/L (ref 98–111)
Creatinine, Ser: 1.4 mg/dL — ABNORMAL HIGH (ref 0.61–1.24)
GFR, Estimated: 53 mL/min — ABNORMAL LOW (ref >60)
Glucose, Bld: 133 mg/dL — ABNORMAL HIGH (ref 70–99)
Potassium: 3.9 mmol/L (ref 3.5–5.1)
Sodium: 139 mmol/L (ref 135–145)
Total Bilirubin: 2.7 mg/dL — ABNORMAL HIGH (ref 0.0–1.2)
Total Protein: 7.5 g/dL (ref 6.5–8.1)

## 2023-08-09 LAB — RESPIRATORY PANEL BY PCR

## 2023-08-09 LAB — I-STAT CHEM 8, ED
BUN: 25 mg/dL — ABNORMAL HIGH (ref 8–23)
BUN: 37 mg/dL — ABNORMAL HIGH (ref 8–23)
Calcium, Ion: 0.99 mmol/L — ABNORMAL LOW (ref 1.15–1.40)
Calcium, Ion: 1.19 mmol/L (ref 1.15–1.40)
Chloride: 108 mmol/L (ref 98–111)
Chloride: 112 mmol/L — ABNORMAL HIGH (ref 98–111)
Creatinine, Ser: 1.2 mg/dL (ref 0.61–1.24)
Creatinine, Ser: 1.3 mg/dL — ABNORMAL HIGH (ref 0.61–1.24)
Glucose, Bld: 124 mg/dL — ABNORMAL HIGH (ref 70–99)
Glucose, Bld: 126 mg/dL — ABNORMAL HIGH (ref 70–99)
HCT: 47 % (ref 39.0–52.0)
HCT: 48 % (ref 39.0–52.0)
Hemoglobin: 16 g/dL (ref 13.0–17.0)
Hemoglobin: 16.3 g/dL (ref 13.0–17.0)
Potassium: 4 mmol/L (ref 3.5–5.1)
Potassium: 8.5 mmol/L (ref 3.5–5.1)
Sodium: 135 mmol/L (ref 135–145)
Sodium: 140 mmol/L (ref 135–145)
TCO2: 22 mmol/L (ref 22–32)
TCO2: 23 mmol/L (ref 22–32)

## 2023-08-09 LAB — CBC
HCT: 47.3 % (ref 39.0–52.0)
HCT: 45.2 % (ref 39.0–52.0)
Hemoglobin: 15.8 g/dL (ref 13.0–17.0)
Hemoglobin: 14.7 g/dL (ref 13.0–17.0)
MCH: 36.6 pg — ABNORMAL HIGH (ref 26.0–34.0)
MCH: 36 pg — ABNORMAL HIGH (ref 26.0–34.0)
MCHC: 33.4 g/dL (ref 30.0–36.0)
MCHC: 32.5 g/dL (ref 30.0–36.0)
MCV: 109.5 fL — ABNORMAL HIGH (ref 80.0–100.0)
MCV: 110.8 fL — ABNORMAL HIGH (ref 80.0–100.0)
Platelets: 90 10*3/uL — ABNORMAL LOW (ref 150–400)
Platelets: 82 10*3/uL — ABNORMAL LOW (ref 150–400)
RBC: 4.32 MIL/uL (ref 4.22–5.81)
RBC: 4.08 MIL/uL — ABNORMAL LOW (ref 4.22–5.81)
RDW: 13.2 % (ref 11.5–15.5)
RDW: 13.5 % (ref 11.5–15.5)
WBC: 10.2 10*3/uL (ref 4.0–10.5)
WBC: 10.6 10*3/uL — ABNORMAL HIGH (ref 4.0–10.5)
nRBC: 0 % (ref 0.0–0.2)
nRBC: 0 % (ref 0.0–0.2)

## 2023-08-09 LAB — CK: Total CK: 750 U/L — ABNORMAL HIGH (ref 49–397)

## 2023-08-09 LAB — I-STAT CG4 LACTIC ACID, ED
Lactic Acid, Venous: 3 mmol/L (ref 0.5–1.9)
Lactic Acid, Venous: 2.9 mmol/L (ref 0.5–1.9)

## 2023-08-09 LAB — TSH: TSH: 1.331 u[IU]/mL (ref 0.350–4.500)

## 2023-08-09 LAB — CREATININE, SERUM
Creatinine, Ser: 1.36 mg/dL — ABNORMAL HIGH (ref 0.61–1.24)
GFR, Estimated: 55 mL/min — ABNORMAL LOW (ref >60)

## 2023-08-09 LAB — VITAMIN B12: Vitamin B-12: 305 pg/mL (ref 180–914)

## 2023-08-09 LAB — VITAMIN D 25 HYDROXY (VIT D DEFICIENCY, FRACTURES): Vit D, 25-Hydroxy: 79.81 ng/mL (ref 30–100)

## 2023-08-09 LAB — PROCALCITONIN: Procalcitonin: 0.54 ng/mL

## 2023-08-09 LAB — LACTIC ACID, PLASMA
Lactic Acid, Venous: 3 mmol/L (ref 0.5–1.9)
Lactic Acid, Venous: 2.1 mmol/L (ref 0.5–1.9)

## 2023-08-09 LAB — AMMONIA: Ammonia: 29 umol/L (ref 9–35)

## 2023-08-09 MED ORDER — FLEET ENEMA RE ENEM: 1.0000 | ENEMA | Freq: Once | RECTAL | Status: DC | PRN

## 2023-08-09 MED ORDER — IPRATROPIUM-ALBUTEROL 0.5-2.5 (3) MG/3ML IN SOLN: 3.0000 mL | Freq: Four times a day (QID) | RESPIRATORY_TRACT | Status: DC

## 2023-08-09 MED ORDER — ONDANSETRON HCL 4 MG PO TABS: 4.0000 mg | ORAL_TABLET | Freq: Four times a day (QID) | ORAL | Status: DC | PRN

## 2023-08-09 MED ORDER — METOPROLOL TARTRATE 5 MG/5ML IV SOLN: 10.0000 mg | INTRAVENOUS | Status: DC | PRN

## 2023-08-09 MED ORDER — SODIUM CHLORIDE 0.9 % IV SOLN
1.0000 g | INTRAVENOUS | Status: AC
  Administered 2023-08-10 – 2023-08-13 (×4): 1 g via INTRAVENOUS
  Filled 2023-08-09 (×4): qty 10

## 2023-08-09 MED ORDER — SODIUM CHLORIDE 0.9 % IV SOLN
1.0000 g | Freq: Once | INTRAVENOUS | Status: AC
  Administered 2023-08-09: 1 g via INTRAVENOUS
  Filled 2023-08-09: qty 10

## 2023-08-09 MED ORDER — ZIPRASIDONE MESYLATE 20 MG IM SOLR
20.0000 mg | Freq: Once | INTRAMUSCULAR | Status: DC | PRN
  Filled 2023-08-09: qty 20

## 2023-08-09 MED ORDER — GUAIFENESIN ER 600 MG PO TB12
600.0000 mg | ORAL_TABLET | Freq: Two times a day (BID) | ORAL | Status: DC
  Administered 2023-08-09 – 2023-08-15 (×12): 600 mg via ORAL
  Filled 2023-08-09 (×14): qty 1

## 2023-08-09 MED ORDER — METOPROLOL TARTRATE 5 MG/5ML IV SOLN: 5.0000 mg | INTRAVENOUS | Status: DC | PRN

## 2023-08-09 MED ORDER — SENNOSIDES-DOCUSATE SODIUM 8.6-50 MG PO TABS: 1.0000 | ORAL_TABLET | Freq: Every evening | ORAL | Status: DC | PRN

## 2023-08-09 MED ORDER — HYDROMORPHONE HCL 1 MG/ML IJ SOLN
0.5000 mg | INTRAMUSCULAR | Status: DC | PRN
  Administered 2023-08-09 – 2023-08-11 (×6): 1 mg via INTRAVENOUS
  Filled 2023-08-09 (×8): qty 1

## 2023-08-09 MED ORDER — ONDANSETRON HCL 4 MG/2ML IJ SOLN
4.0000 mg | Freq: Four times a day (QID) | INTRAMUSCULAR | Status: DC | PRN
Start: 2023-08-09 — End: 2023-08-28

## 2023-08-09 MED ORDER — DEXTROSE 5 % IV SOLN
250.0000 mg | INTRAVENOUS | Status: DC
  Administered 2023-08-10: 250 mg via INTRAVENOUS
  Filled 2023-08-09 (×3): qty 2.5

## 2023-08-09 MED ORDER — HYDRALAZINE HCL 20 MG/ML IJ SOLN: 10.0000 mg | INTRAMUSCULAR | Status: DC | PRN

## 2023-08-09 MED ORDER — ONDANSETRON HCL 4 MG/2ML IJ SOLN: 4.0000 mg | Freq: Four times a day (QID) | INTRAMUSCULAR | Status: DC | PRN

## 2023-08-09 MED ORDER — SODIUM CHLORIDE 0.9 % IV BOLUS
1000.0000 mL | Freq: Once | INTRAVENOUS | Status: AC
  Administered 2023-08-09: 1000 mL via INTRAVENOUS

## 2023-08-09 MED ORDER — METHYLPREDNISOLONE SODIUM SUCC 40 MG IJ SOLR
40.0000 mg | Freq: Three times a day (TID) | INTRAMUSCULAR | Status: DC
  Administered 2023-08-09 – 2023-08-17 (×21): 40 mg via INTRAVENOUS
  Filled 2023-08-09 (×21): qty 1

## 2023-08-09 MED ORDER — ENOXAPARIN SODIUM 40 MG/0.4ML IJ SOSY
40.0000 mg | PREFILLED_SYRINGE | INTRAMUSCULAR | Status: DC
  Administered 2023-08-10 – 2023-08-25 (×16): 40 mg via SUBCUTANEOUS
  Filled 2023-08-09 (×17): qty 0.4

## 2023-08-09 MED ORDER — SODIUM CHLORIDE 0.9 % IV SOLN: INTRAVENOUS | Status: DC

## 2023-08-09 MED ORDER — ACETAMINOPHEN 650 MG RE SUPP: 650.0000 mg | Freq: Four times a day (QID) | RECTAL | Status: DC | PRN

## 2023-08-09 MED ORDER — BISACODYL 5 MG PO TBEC: 5.0000 mg | DELAYED_RELEASE_TABLET | Freq: Every day | ORAL | Status: DC | PRN

## 2023-08-09 MED ORDER — ACETAMINOPHEN 325 MG PO TABS
650.0000 mg | ORAL_TABLET | Freq: Four times a day (QID) | ORAL | Status: DC | PRN
  Administered 2023-08-24: 650 mg via ORAL
  Filled 2023-08-09: qty 2

## 2023-08-09 MED ORDER — IPRATROPIUM-ALBUTEROL 0.5-2.5 (3) MG/3ML IN SOLN
3.0000 mL | RESPIRATORY_TRACT | Status: DC | PRN
  Administered 2023-08-09: 3 mL via RESPIRATORY_TRACT
  Filled 2023-08-09: qty 3

## 2023-08-09 MED ORDER — IPRATROPIUM-ALBUTEROL 0.5-2.5 (3) MG/3ML IN SOLN
3.0000 mL | Freq: Once | RESPIRATORY_TRACT | Status: AC
  Administered 2023-08-09: 3 mL via RESPIRATORY_TRACT
  Filled 2023-08-09: qty 3

## 2023-08-09 MED ORDER — SODIUM CHLORIDE 0.9 % IV SOLN
500.0000 mg | Freq: Once | INTRAVENOUS | Status: AC
  Administered 2023-08-09: 500 mg via INTRAVENOUS
  Filled 2023-08-09: qty 5

## 2023-08-09 MED ORDER — GUAIFENESIN 100 MG/5ML PO LIQD: 5.0000 mL | ORAL | Status: DC | PRN

## 2023-08-09 MED ORDER — NICOTINE 21 MG/24HR TD PT24: 21.0000 mg | MEDICATED_PATCH | Freq: Every day | TRANSDERMAL | Status: DC | PRN

## 2023-08-09 MED ORDER — GLUCAGON HCL RDNA (DIAGNOSTIC) 1 MG IJ SOLR: 1.0000 mg | INTRAMUSCULAR | Status: DC | PRN

## 2023-08-09 MED ORDER — BUDESONIDE 0.5 MG/2ML IN SUSP
0.5000 mg | Freq: Two times a day (BID) | RESPIRATORY_TRACT | Status: DC
  Administered 2023-08-09 – 2023-08-27 (×31): 0.5 mg via RESPIRATORY_TRACT
  Filled 2023-08-09 (×36): qty 2

## 2023-08-09 MED ORDER — OXYCODONE HCL 5 MG PO TABS
5.0000 mg | ORAL_TABLET | ORAL | Status: DC | PRN
  Administered 2023-08-12: 5 mg via ORAL
  Filled 2023-08-09: qty 1

## 2023-08-09 NOTE — Progress Notes (Signed)
 Tried calling son and sister to obtain admission information. Both telephone numbers provided and not in service.

## 2023-08-09 NOTE — ED Notes (Signed)
 1236 PT placed in c [facility]

## 2023-08-09 NOTE — H&P (Signed)
 History and Physical    Francisco Garcia VFI:[id number] DOB: 1950/06/02 DOA: 08/09/2023  PCP: [name], MD Patient coming from: Home  Chief Complaint: AMS  HPI: Francisco Garcia is a 73 y.o. male with medical history significant of COPD, CAD status post CABG, HTN, PAD, MI, bladder cancer comes to the hospital for evaluation of fall/syncope.  History is very limited as patient cannot recall any events from yesterday and overall he is a very poor historian. Apparently patient was last seen normal yesterday per ER provider when he spoke with the family.  He lives at [facility] in [place] brought by EMS.  Initially also reported his speech to be abnormal as well. Most of the history is per chart review on what basic patient and EDP can provide.  In the ER patient was noted to have bilateral diffuse rhonchi.  His trauma workup was negative for any acute pathology.  Creatinine was slightly elevated, had lactic acidosis and rhabdomyolysis.  Patient was given Rocephin, azithromycin and medical team was requested to admit the patient.  Due to slightly elevated total bilirubin, right upper quadrant ultrasound was done which was also negative.  When I saw the patient he was quiet disheveled but overall was able to answer very basic questions and follow basic commands.  No focal neurodeficits were identified.     History reviewed. No pertinent past medical history.  History reviewed. No pertinent surgical history.  SOCIAL HISTORY:  reports that he quit smoking about 8 years ago. He has never used smokeless tobacco. No history on file for alcohol use and drug use.  Allergies  Allergen Reactions   Aspirin    Penicillins     FAMILY HISTORY: History reviewed. No pertinent family history.   Prior to Admission medications   Medication Sig Start Date End Date Taking? Authorizing Provider  ADVAIR DISKUS 250-50 MCG/DOSE AEPB  07/24/14   [provider]  ALPRAZolam Prudy Feeler) 1 MG  tablet  09/19/14   [provider]  amLODipine (NORVASC) 10 MG tablet Take 10 mg by mouth in the morning. 09/18/14   [provider]  atorvastatin (LIPITOR) 20 MG tablet Take 20 mg by mouth at bedtime. 07/17/14   [provider]  cloNIDine (CATAPRES) 0.2 MG tablet  09/08/14   [provider]  clopidogrel (PLAVIX) 75 MG tablet  09/18/14   [provider]  fluocinonide cream (LIDEX) 0.05 %  09/11/14   [provider]  fluticasone Aleda Grana) 50 MCG/ACT nasal spray  07/24/14   [provider]  hydrocortisone cream 1 %  08/30/14   [provider]  hydrOXYzine (ATARAX/VISTARIL) 25 MG tablet  09/05/14   [provider]  nitrofurantoin, macrocrystal-monohydrate, (MACROBID) 100 MG capsule  09/18/14   [provider]  NITROSTAT 0.4 MG SL tablet  09/03/14   [provider]  omeprazole (PRILOSEC) 40 MG capsule Take 40 mg by mouth daily. 09/07/15   [provider]  oxyCODONE-acetaminophen (PERCOCET) 10-325 MG per tablet  09/19/14   [provider]  pantoprazole (PROTONIX) 40 MG tablet  09/01/14   [provider]  PROAIR HFA 108 (90 BASE) MCG/ACT inhaler Inhale 1-2 puffs into the lungs every 6 (six) hours as needed for wheezing or shortness of breath. 07/24/14   [provider]  Skin Protectants, Misc. (EUCERIN) cream Apply topically as needed for wound care. 03/31/15   Sheard, Myeong O, DPM  valsartan (DIOVAN) 160 MG tablet  09/04/14   [provider]  Vitamin D, Ergocalciferol, (  DRISDOL) 1.25 MG (50000 UNIT) CAPS capsule Take 50,000 Units by mouth every 7 (seven) days. 09/04/21   [provider]    Physical Exam: Vitals:   08/09/23 1230 08/09/23 1245 08/09/23 1530 08/09/23 1702  BP: (!) 174/87 (!) 171/79 (!) 156/98   Pulse: (!) 41 (!) 55 100   Resp: (!) 21 17 (!) 21   Temp:    98.2 F (36.8 C)  TempSrc:      SpO2: 99% 97% 95%   Weight:      Height:           Constitutional: NAD, calm, comfortable, 2 L nasal cannula Eyes: PERRL, lids and conjunctivae normal ENMT: Mucous membranes are moist. Posterior pharynx clear of any exudate or lesions.Normal dentition.  Neck: normal, supple, no masses, no thyromegaly Respiratory: Diffuse rhonchi Cardiovascular: Regular rate and rhythm, no murmurs / rubs / gallops. No extremity edema. 2+ pedal pulses. No carotid bruits.  Abdomen: no tenderness, no masses palpated. No hepatosplenomegaly. Bowel sounds positive.  Musculoskeletal: no clubbing / cyanosis. No joint deformity upper and lower extremities. Good ROM, no contractures. Normal muscle tone.  Skin: no rashes, lesions, ulcers. No induration Neurologic: No deficits.  Alert to name and place Psychiatric: Poor Francisco Garcia   Body mass index is 26.59 kg/m.      Labs on Admission: I have personally reviewed following labs and imaging studies  CBC: Recent Labs  Lab 08/09/23 1223 08/09/23 1243 08/09/23 1304  WBC 10.2  --   --   HGB 15.8 16.0 16.3  HCT 47.3 47.0 48.0  MCV 109.5*  --   --   PLT 90*  --   --    Basic Metabolic Panel: Recent Labs  Lab 08/09/23 1223 08/09/23 1243 08/09/23 1304  NA 139 135 140  K 3.9 8.5* 4.0  CL 107 112* 108  CO2 22  --   --   GLUCOSE 133* 124* 126*  BUN 23 37* 25*  CREATININE 1.40* 1.20 1.30*  CALCIUM 9.3  --   --    GFR: Estimated Creatinine Clearance: 47.3 mL/min (A) (by C-G formula based on SCr of 1.3 mg/dL (H)). Liver Function Tests: Recent Labs  Lab 08/09/23 1223  AST 42*  ALT 22  ALKPHOS 95  BILITOT 2.7*  PROT 7.5  ALBUMIN 3.3*   No results for input(s): "LIPASE", "AMYLASE" in the last 168 hours. No results for input(s): "AMMONIA" in the last 168 hours. Coagulation Profile: No results for input(s): "INR", "PROTIME" in the last 168 hours. Cardiac Enzymes: Recent Labs  Lab 08/09/23 1223  CKTOTAL 750*   BNP (last 3 results) No results for input(s): "PROBNP" in the last 8760  hours. HbA1C: No results for input(s): "HGBA1C" in the last 72 hours. CBG: No results for input(s): "GLUCAP" in the last 168 hours. Lipid Profile: No results for input(s): "CHOL", "HDL", "LDLCALC", "TRIG", "CHOLHDL", "LDLDIRECT" in the last 72 hours. Thyroid Function Tests: No results for input(s): "TSH", "T4TOTAL", "FREET4", "T3FREE", "THYROIDAB" in the last 72 hours. Anemia Panel: No results for input(s): "VITAMINB12", "FOLATE", "FERRITIN", "TIBC", "IRON", "RETICCTPCT" in the last 72 hours. Urine analysis: No results found for: "COLORURINE", "APPEARANCEUR", "LABSPEC", "PHURINE", "GLUCOSEU", "HGBUR", "BILIRUBINUR", "KETONESUR", "PROTEINUR", "UROBILINOGEN", "NITRITE", "LEUKOCYTESUR" Sepsis Labs: !!!!!!!!!!!!!!!!!!!!!!!!!!!!!!!!!!!!!!!!!!!! @LABRCNTIP (procalcitonin:4,lacticidven:4) )No results found for this or any previous visit (from the past 240 hours).   Radiological Exams on Admission: US Abdomen Limited RUQ (LIVER/GB) Result Date: 08/09/2023 CLINICAL DATA:  151470 RUQ abdominal pain 151470. EXAM: ULTRASOUND ABDOMEN LIMITED RIGHT UPPER QUADRANT COMPARISON:  None Available. FINDINGS: Gallbladder: Physiologically distended. No abnormal wall thickening or pericholecystic free fluid. There is small to moderate amount of sludge. Sonographic Murphy's sign was negative as per the technologist. Common bile duct: Diameter: Up to 4.5 mm.  No intrahepatic bile duct dilation.  None Liver: There is subtle liver surface nodularity, concerning for cirrhosis. There is heterogeneous echotexture. Normal echogenicity. No discrete focal mass seen on the provided images. Portal vein is patent on color Doppler imaging with normal direction of blood flow towards the liver. Other: None. IMPRESSION: 1. Subtle liver surface nodularity, concerning for cirrhosis. No discrete focal liver lesion seen on the provided images. 2. Small to moderate amount of gallbladder sludge without sonographic evidence of acute  cholecystitis. Electronically Signed   By: Jules Schick M.D.   On: 08/09/2023 16:19   DG Shoulder Left Result Date: 08/09/2023 CLINICAL DATA:  Larey Seat, left shoulder pain EXAM: LEFT SHOULDER - 2+ VIEW COMPARISON:  None Available. FINDINGS: Internal rotation, external rotation, transscapular views of the left shoulder are obtained. No acute displaced fracture. There is marked decrease in the acromial humeral interval consistent with chronic longstanding rotator cuff tear. Mild glenohumeral joint osteoarthritis. Soft tissues are unremarkable. Visualized portions of the left chest are clear. IMPRESSION: 1. Narrowing of the acromial humeral interval, consistent with chronic longstanding rotator cuff tear. 2. Mild glenohumeral osteoarthritis. 3. No acute fracture. Electronically Signed   By: Sharlet Salina M.D.   On: 08/09/2023 15:38   CT Head Wo Contrast Result Date: 08/09/2023 CLINICAL DATA:  Head trauma, found down by EMS. EXAM: CT HEAD WITHOUT CONTRAST CT CERVICAL SPINE WITHOUT CONTRAST TECHNIQUE: Multidetector CT imaging of the head and cervical spine was performed following the standard protocol without intravenous contrast. Multiplanar CT image reconstructions of the cervical spine were also generated. RADIATION DOSE REDUCTION: This exam was performed according to the departmental dose-optimization program which includes automated exposure control, adjustment of the mA and/or kV according to patient size and/or use of iterative reconstruction technique. COMPARISON:  CT head 11/07/2022, CT cervical spine 11/27/2021 FINDINGS: CT HEAD FINDINGS Brain: No acute intracranial hemorrhage. No CT evidence of acute infarct. Nonspecific hypoattenuation in the periventricular and subcortical white matter favored to reflect chronic microvascular ischemic changes. Mild generalized parenchymal volume loss. No edema, mass effect, or midline shift. The basilar cisterns are patent. Ventricles: Prominence of the ventricles  suggesting underlying parenchymal volume loss. Vascular: Atherosclerotic calcifications of the carotid siphons and intracranial vertebral arteries. No hyperdense vessel. Skull: No acute or aggressive finding. Orbits: Orbits are symmetric. Sinuses: Moderate mucosal thickening in the ethmoid sinuses with additional scattered mucosal thickening in the sphenoid sinuses and right frontal sinus. Other: Mastoid air cells are clear. CT CERVICAL SPINE FINDINGS Alignment: Cervical lordosis is maintained. No listhesis. No facet subluxation or dislocation. Skull base and vertebrae: No compression fracture or displaced fracture in the cervical spine. Soft tissues and spinal canal: No prevertebral fluid or swelling. No visible canal hematoma. Prominent atherosclerotic calcifications at the carotid bifurcations. Disc levels: Intervertebral disc space narrowing greatest at C5-6 and C6-7. Disc bulges at multiple levels. Disc osteophyte complex at C6-7 results in mild spinal canal stenosis. Upper chest: Interlobular septal thickening which could reflect pulmonary edema. Biapical pleural-parenchymal scarring. Emphysema. Other: None. IMPRESSION: No CT evidence of acute intracranial abnormality. No acute fracture or traumatic malalignment of the cervical spine. Interlobular septal thickening which could reflect pulmonary edema. Emphysema. Electronically Signed   By: Emily Filbert M.D.   On: 08/09/2023 15:20   CT Cervical  Spine Wo Contrast Result Date: 08/09/2023 CLINICAL DATA:  Head trauma, found down by EMS. EXAM: CT HEAD WITHOUT CONTRAST CT CERVICAL SPINE WITHOUT CONTRAST TECHNIQUE: Multidetector CT imaging of the head and cervical spine was performed following the standard protocol without intravenous contrast. Multiplanar CT image reconstructions of the cervical spine were also generated. RADIATION DOSE REDUCTION: This exam was performed according to the departmental dose-optimization program which includes automated exposure  control, adjustment of the mA and/or kV according to patient size and/or use of iterative reconstruction technique. COMPARISON:  CT head 11/07/2022, CT cervical spine 11/27/2021 FINDINGS: CT HEAD FINDINGS Brain: No acute intracranial hemorrhage. No CT evidence of acute infarct. Nonspecific hypoattenuation in the periventricular and subcortical white matter favored to reflect chronic microvascular ischemic changes. Mild generalized parenchymal volume loss. No edema, mass effect, or midline shift. The basilar cisterns are patent. Ventricles: Prominence of the ventricles suggesting underlying parenchymal volume loss. Vascular: Atherosclerotic calcifications of the carotid siphons and intracranial vertebral arteries. No hyperdense vessel. Skull: No acute or aggressive finding. Orbits: Orbits are symmetric. Sinuses: Moderate mucosal thickening in the ethmoid sinuses with additional scattered mucosal thickening in the sphenoid sinuses and right frontal sinus. Other: Mastoid air cells are clear. CT CERVICAL SPINE FINDINGS Alignment: Cervical lordosis is maintained. No listhesis. No facet subluxation or dislocation. Skull base and vertebrae: No compression fracture or displaced fracture in the cervical spine. Soft tissues and spinal canal: No prevertebral fluid or swelling. No visible canal hematoma. Prominent atherosclerotic calcifications at the carotid bifurcations. Disc levels: Intervertebral disc space narrowing greatest at C5-6 and C6-7. Disc bulges at multiple levels. Disc osteophyte complex at C6-7 results in mild spinal canal stenosis. Upper chest: Interlobular septal thickening which could reflect pulmonary edema. Biapical pleural-parenchymal scarring. Emphysema. Other: None. IMPRESSION: No CT evidence of acute intracranial abnormality. No acute fracture or traumatic malalignment of the cervical spine. Interlobular septal thickening which could reflect pulmonary edema. Emphysema. Electronically Signed   By:  Emily Filbert M.D.   On: 08/09/2023 15:20   DG Pelvis Portable Result Date: 08/09/2023 CLINICAL DATA:  Larey Seat, left hip discomfort EXAM: PORTABLE PELVIS 1-2 VIEWS COMPARISON:  None Available. FINDINGS: Frontal view of the pelvis includes both hips. No fracture, subluxation, or dislocation. Symmetrical bilateral hip osteoarthritis. Extensive atherosclerosis. The sacroiliac joints are normal. IMPRESSION: 1. Bilateral hip osteoarthritis.  No acute fracture. Electronically Signed   By: Sharlet Salina M.D.   On: 08/09/2023 15:01   DG Chest Portable 1 View Result Date: 08/09/2023 CLINICAL DATA:  Fall.  Altered mental status. EXAM: PORTABLE CHEST 1 VIEW COMPARISON:  04/11/2023. FINDINGS: Redemonstration of diffuse increased interstitial markings essentially similar to several prior studies. However, there is new heterogeneous opacity overlying the medial aspect of the right upper lung zone measuring approximately 4 x 4.8 cm, concerning for consolidation. Correlate clinically to determine the need for further imaging with chest CT scan versus follow-up chest radiograph to document resolution. Bilateral lung fields are otherwise clear. Bilateral costophrenic angles are clear. Stable cardio-mediastinal silhouette. No acute osseous abnormalities. The soft tissues are within normal limits. IMPRESSION: *New heterogeneous opacity overlying the medial aspect of the right upper lung zone, concerning for consolidation. Correlate clinically to determine the need for further imaging with chest CT scan versus follow-up chest radiograph to document resolution. Electronically Signed   By: Jules Schick M.D.   On: 08/09/2023 15:00    Nutritional status  All images have been reviewed by me personally.    Assessment/Plan Principal Problem:   AMS (altered  mental status) Active Problems:   CAP (community acquired pneumonia)   Fall   Acute hypoxia Community-acquired pneumonia History of COPD - Does not report to be on  any oxygen at home.  Currently requiring 2-3 L of nasal cannula.  Does have diffuse rhonchi.  For now we will start him on Solu-Medrol, bronchodilators scheduled and as needed, I-S/flutter valve.  Will check procalcitonin and BNP as well.  Check COVID, RSV, flu, respiratory panel  Altered mental status, improved - Able to answer basic questions but quite forgetful.  Will check ammonia, B12, TSH, folate, vitamin D, UA, UDS.  Currently no focal deficit.  CT of the head is negative.  Initially reported his speech to be abnormal therefore will obtain MRI brain as well  Mild renal insufficiency Rhabdomyolysis and lactic acidosis -CK level 750, creatinine 1.3 baseline 0.9.  IV fluids.  Trend CK levels and lactic acid as well.   Essential hypertension - Home meds currently on hold until verified.  IV as needed  History of CAD status post CABG - Limited history.  Currently chest pain-free.  Once med rec completed, will add medications appropriately.  History of bladder cancer? BPH - Seen outpatient urology underwent TURBT last year  Paroxysmal atrial fibrillation? - Per record appears to be on Eliquis outpatient, will need to confirm this prior to resuming this.     DVT prophylaxis: Lovenox Code Status: Presumed full code Family Communication: Attempted to call sister Arna Medici, no answer Consults called: None Admission status: Medical admission to telemetry  Status is: Inpatient Remains inpatient appropriate because: Need at least 2 midnight hospital stay   Time Spent: 65 minutes.  >50% of the time was devoted to discussing the patients care, assessment, plan and disposition with other care givers along with counseling the patient about the risks and benefits of treatment.    Miguel Rota MD [facility]  If 7PM-7AM, please contact night-coverage   08/09/2023, 6:01 PM

## 2023-08-09 NOTE — ED Triage Notes (Signed)
 Pt coming in from [place] [facility] in [place]. Pt doesn't know if he hit head .unknown loc. Unknown time down. Pt found down on his back by ems. Last know well 1400 yesterday 3/18. Alert and oriented x2 for ems. Daughter reports changes in speech . Pt initially had weakness in left arm and right leg. Ems gave douneb. Ems vitals 186/89 Hr 80 to 130  Spo2 95% with [place] 2l Cbg 124

## 2023-08-09 NOTE — Progress Notes (Signed)
 [facility] night cross cover note:  I was notified by RN that this patient is agitated, combative, attempting to get out of bed on his own, with these behaviors refractory to attempts at verbal redirection.  In the setting of associated interference with ongoing medical treatment posing potential harm to themself, I have placed order for Geodon 20 mg IM x 1 dose prn for agitation.   Francisco Pigg, DO Hospitalist

## 2023-08-09 NOTE — Progress Notes (Signed)
 [facility] night cross cover note:   I was notified by RN of critical lactic acid level of 3.0 compared to most recent prior value of 2.9.  Patient remains on normal saline at 75 cc, and there is an existing order for repeat lactic acid level.  Signs appear similar to earlier, remaining afebrile, most recent blood pressure 153/84, and oxygen saturations remained in the mid 90s on 2 L nasal cannula, unchanged from this afternoon.  Will continue the existing IV fluids, and follow for result of repeat lactate, as above.     Newton Pigg, DO Hospitalist

## 2023-08-09 NOTE — ED Provider Notes (Signed)
 Care assumed from [place], PA-C at shift change. Please see their note for further information.  Briefly: Patient found down on the ground earlier today. Last spoken to yesterday. Altered, moving all extremities, A&Ox2.   Plan: plan for admission, likely pneumonia given o2 requirement, rhonchi, and CXR findings. Currently maintaining on 3L. Also with creatinine 1.40, most recently 0.93 on 07/14/23. Also with T bili 2.7 and therefore RUQ [place] ordered. No abdominal TTP. CK 750 concerning for developing rhabdo, lactic 3.0 --> 2.9. Given fluids, rocephin, azithro.   Imaging pending at shift change, after resulted will need admission.   CT head and c spine have resulted and reveal  No CT evidence of acute intracranial abnormality.   No acute fracture or traumatic malalignment of the cervical spine.   Interlobular septal thickening which could reflect pulmonary edema.   Emphysema.  DG shoulder has resulted and reveals  1. Narrowing of the acromial humeral interval, consistent with chronic longstanding rotator cuff tear. 2. Mild glenohumeral osteoarthritis. 3. No acute fracture.  RUQ US reveals  1. Subtle liver surface nodularity, concerning for cirrhosis. No discrete focal liver lesion seen on the provided images. 2. Small to moderate amount of gallbladder sludge without sonographic evidence of acute cholecystitis.  I have personally reviewed and interpreted this imaging and agree with radiology interpretation.   Upon reassessment, patient able to move all extremities and follow commands but is alert to self only. On 3 L O2 via [place]. Likely due to pneumonia. He will require admission.   Discussed patient with hospitalist who accepts patient for admission   Vear Clock 08/09/23 1940    Terald Sleeper, MD 08/09/23 2322

## 2023-08-09 NOTE — ED Provider Notes (Signed)
 [place] EMERGENCY DEPARTMENT AT [facility] Provider Note   CSN: [id number] Arrival date & time: 08/09/23  1211     History  No chief complaint on file.   Francisco Garcia is a 73 y.o. male with past medical history of peripheral arterial disease, hypertension, COPD, heart failure, coronary artery disease, status post CABG currently taking Plavix who presents with unwitnessed fall, unknown downtime, last spoke to his daughter on the phone yesterday.  Concerned about changes in speech per daughter.  Initially reportedly with some weakness in left arm, right leg but normal movement of extremities, normal sensation throughout at this time.  He endorses some pain in the left shoulder.  He denies any other pain, no signs of trauma on the head but he is not sure.  HPI     Home Medications Prior to Admission medications   Medication Sig Start Date End Date Taking? Authorizing Provider  ADVAIR DISKUS 250-50 MCG/DOSE AEPB  07/24/14   [provider]  ALPRAZolam Prudy Feeler) 1 MG tablet  09/19/14   [provider]  amLODipine (NORVASC) 10 MG tablet  09/18/14   [provider]  atorvastatin (LIPITOR) 20 MG tablet  07/17/14   [provider]  cloNIDine (CATAPRES) 0.2 MG tablet  09/08/14   [provider]  clopidogrel (PLAVIX) 75 MG tablet  09/18/14   [provider]  fluocinonide cream (LIDEX) 0.05 %  09/11/14   [provider]  fluticasone Aleda Grana) 50 MCG/ACT nasal spray  07/24/14   [provider]  hydrocortisone cream 1 %  08/30/14   [provider]  hydrOXYzine (ATARAX/VISTARIL) 25 MG tablet  09/05/14   [provider]  nitrofurantoin, macrocrystal-monohydrate, (MACROBID) 100 MG capsule  09/18/14   [provider]  NITROSTAT 0.4 MG SL tablet  09/03/14   [provider]  oxyCODONE-acetaminophen (PERCOCET) 10-325 MG per tablet  09/19/14   [provider]  pantoprazole (PROTONIX) 40  MG tablet  09/01/14   [provider]  PROAIR HFA 108 (90 BASE) MCG/ACT inhaler  07/24/14   [provider]  Skin Protectants, Misc. (EUCERIN) cream Apply topically as needed for wound care. 03/31/15   Sheard, Myeong O, DPM  valsartan (DIOVAN) 160 MG tablet  09/04/14   [provider]      Allergies    Aspirin and Penicillins    Review of Systems   Review of Systems  All other systems reviewed and are negative.   Physical Exam Updated Vital Signs There were no vitals taken for this visit. Physical Exam Vitals and nursing note reviewed.  Constitutional:      General: He is not in acute distress.    Appearance: Normal appearance. He is ill-appearing.  HENT:     Head: Normocephalic and atraumatic.  Eyes:     General:        Right eye: No discharge.        Left eye: No discharge.  Cardiovascular:     Rate and Rhythm: Normal rate and regular rhythm.     Heart sounds: No murmur heard.    No friction rub. No gallop.  Pulmonary:     Effort: Pulmonary effort is normal.     Breath sounds: Normal breath sounds.  Abdominal:     General: Bowel sounds are normal.     Palpations: Abdomen is soft.  Musculoskeletal:     Comments: No obvious step-off, deformity, soft tissue swelling, bruising throughout, he does have some tenderness when I press  his left shoulder, no significant midline cervical or lumbar spinal tenderness, moving all 4 limbs spontaneously.  Skin:    General: Skin is warm and dry.     Capillary Refill: Capillary refill takes less than 2 seconds.  Neurological:     Mental Status: He is alert.     Comments: Moves all 4 limbs spontaneously, CN II through XII grossly intact  Psychiatric:        Mood and Affect: Mood normal.        Behavior: Behavior normal.     ED Results / Procedures / Treatments   Labs (all labs ordered are listed, but only abnormal results are displayed) Labs Reviewed - No data to display  EKG None  Radiology No results  found.  Procedures Procedures    Medications Ordered in ED Medications - No data to display  ED Course/ Medical Decision Making/ A&P                                 Medical Decision Making Amount and/or Complexity of Data Reviewed Labs: ordered. Radiology: ordered.  Risk Prescription drug management.   This patient is a 73 y.o. male  who presents to the ED for concern of fall, unknown downtime.   Differential diagnoses prior to evaluation: The emergent differential diagnosis includes, but is not limited to,  epidural hematoma, subdural hematoma, skull fracture, subarachnoid hemorrhage, unstable cervical spine fracture, concussion vs other MSK injury, CVA, spinal cord injury, ACS, arrhythmia, syncope, orthostatic hypotension, sepsis, hypoglycemia, hypoxia, electrolyte disturbance, endocrine disorder, anemia, environmental exposure, polypharmacy . This is not an exhaustive differential.   Past Medical History / Co-morbidities / Social History: peripheral arterial disease, hypertension, COPD, heart failure, coronary artery disease, status post CABG currently taking Plavix  Physical Exam: Physical exam performed. The pertinent findings include: No obvious step-off, deformity, soft tissue swelling, bruising throughout, he does have some tenderness when I press his left shoulder, no significant midline cervical or lumbar spinal tenderness, moving all 4 limbs spontaneously.   Moves all 4 limbs spontaneously, CN II through XII grossly intact  Lab Tests/Imaging studies: I personally interpreted labs/imaging and the pertinent results include:  Creatinine elevated 1.4, his total bilirubin is persistently elevated 2.7, no recent compare. CK is elevated at 750 suspicious for developing rhabdomyolysis. Lactic acid initially 3.1, improved to 2.9 prior to fluids, after fluids recheck is pending. CBC without leukocytosis, he does have a pretty significant thrombocytopenia, platelets 90. Raynald Blend  interpreted plain film radiograph of the chest, pelvis which shows arthritis, focal consolidation in the right upper lobe concerning for pneumonia given his coarse rhonchi throughout that pneumonia is likely diagnosis and we will presumptively treat.  CT of the head, C-spine and left shoulder pending at this time.  I agree with the radiologist interpretation.  Cardiac monitoring: EKG obtained and interpreted by myself and attending physician which shows: A-fib with RVR   Medications: I ordered medication including fluids for elevated lactic acid, duoneb for wheezing, rhonchi, rocephin, azithro for CAP.  I have reviewed the patients home medicines and have made adjustments as needed.  3:18 PM Care of Franco Melnik transferred to [facility] and Dr. Renaye Rakers at the end of my shift as the patient will require reassessment once labs/imaging have resulted. Patient presentation, ED course, and plan of care discussed with review of all pertinent labs and imaging. Please see his/her note for further details regarding further ED  course and disposition. Plan at time of handoff is pending imaging, reassessment, plan for CAP admission if no other pathology when workup complete. This may be altered or completely changed at the discretion of the oncoming team pending results of further workup.   Final Clinical Impression(s) / ED Diagnoses Final diagnoses:  None    Rx / DC Orders ED Discharge Orders     None         [place] 08/09/23 1519    Gerhard Munch, MD 08/10/23 [phone]

## 2023-08-09 NOTE — ED Notes (Signed)
 The patient's son would like to be contacted regarding his father's care. He can be reached at [phone].

## 2023-08-10 DIAGNOSIS — N179 Acute kidney failure, unspecified: Secondary | ICD-10-CM | POA: Diagnosis not present

## 2023-08-10 DIAGNOSIS — I160 Hypertensive urgency: Secondary | ICD-10-CM | POA: Diagnosis present

## 2023-08-10 DIAGNOSIS — J189 Pneumonia, unspecified organism: Secondary | ICD-10-CM

## 2023-08-10 DIAGNOSIS — J441 Chronic obstructive pulmonary disease with (acute) exacerbation: Secondary | ICD-10-CM | POA: Diagnosis present

## 2023-08-10 DIAGNOSIS — I509 Heart failure, unspecified: Secondary | ICD-10-CM

## 2023-08-10 DIAGNOSIS — R4182 Altered mental status, unspecified: Secondary | ICD-10-CM | POA: Diagnosis not present

## 2023-08-10 DIAGNOSIS — J9601 Acute respiratory failure with hypoxia: Secondary | ICD-10-CM | POA: Diagnosis not present

## 2023-08-10 DIAGNOSIS — E872 Acidosis, unspecified: Secondary | ICD-10-CM | POA: Diagnosis present

## 2023-08-10 DIAGNOSIS — G9341 Metabolic encephalopathy: Secondary | ICD-10-CM | POA: Diagnosis present

## 2023-08-10 LAB — CBC
HCT: 47.1 % (ref 39.0–52.0)
Hemoglobin: 15.5 g/dL (ref 13.0–17.0)
MCH: 36.6 pg — ABNORMAL HIGH (ref 26.0–34.0)
MCHC: 32.9 g/dL (ref 30.0–36.0)
MCV: 111.3 fL — ABNORMAL HIGH (ref 80.0–100.0)
Platelets: 86 10*3/uL — ABNORMAL LOW (ref 150–400)
RBC: 4.23 MIL/uL (ref 4.22–5.81)
RDW: 13.5 % (ref 11.5–15.5)
WBC: 8.4 10*3/uL (ref 4.0–10.5)
nRBC: 0 % (ref 0.0–0.2)

## 2023-08-10 LAB — URINALYSIS, W/ REFLEX TO CULTURE (INFECTION SUSPECTED)
Bilirubin Urine: NEGATIVE
Glucose, UA: NEGATIVE mg/dL
Ketones, ur: NEGATIVE mg/dL
Leukocytes,Ua: NEGATIVE
Nitrite: NEGATIVE
Protein, ur: 100 mg/dL — AB
Specific Gravity, Urine: 1.018 (ref 1.005–1.030)
pH: 5 (ref 5.0–8.0)

## 2023-08-10 LAB — COMPREHENSIVE METABOLIC PANEL
ALT: 29 U/L (ref 0–44)
AST: 52 U/L — ABNORMAL HIGH (ref 15–41)
Albumin: 3 g/dL — ABNORMAL LOW (ref 3.5–5.0)
Alkaline Phosphatase: 78 U/L (ref 38–126)
Anion gap: 12 (ref 5–15)
BUN: 28 mg/dL — ABNORMAL HIGH (ref 8–23)
CO2: 19 mmol/L — ABNORMAL LOW (ref 22–32)
Calcium: 9.2 mg/dL (ref 8.9–10.3)
Chloride: 110 mmol/L (ref 98–111)
Creatinine, Ser: 1.14 mg/dL (ref 0.61–1.24)
GFR, Estimated: >60 mL/min (ref >60)
Glucose, Bld: 125 mg/dL — ABNORMAL HIGH (ref 70–99)
Potassium: 4.9 mmol/L (ref 3.5–5.1)
Sodium: 141 mmol/L (ref 135–145)
Total Bilirubin: 2.1 mg/dL — ABNORMAL HIGH (ref 0.0–1.2)
Total Protein: 6.9 g/dL (ref 6.5–8.1)

## 2023-08-10 LAB — STREP PNEUMONIAE URINARY ANTIGEN: Strep Pneumo Urinary Antigen: NEGATIVE

## 2023-08-10 LAB — AMMONIA: Ammonia: 31 umol/L (ref 9–35)

## 2023-08-10 LAB — PHOSPHORUS: Phosphorus: 3.7 mg/dL (ref 2.5–4.6)

## 2023-08-10 LAB — BRAIN NATRIURETIC PEPTIDE: B Natriuretic Peptide: 1312.4 pg/mL — ABNORMAL HIGH (ref 0.0–100.0)

## 2023-08-10 LAB — MAGNESIUM: Magnesium: 2.4 mg/dL (ref 1.7–2.4)

## 2023-08-10 LAB — CK: Total CK: 557 U/L — ABNORMAL HIGH (ref 49–397)

## 2023-08-10 LAB — FOLATE: Folate: 19.1 ng/mL (ref >5.9)

## 2023-08-10 LAB — LACTIC ACID, PLASMA: Lactic Acid, Venous: 1.4 mmol/L (ref 0.5–1.9)

## 2023-08-10 MED ORDER — IPRATROPIUM-ALBUTEROL 0.5-2.5 (3) MG/3ML IN SOLN
3.0000 mL | Freq: Four times a day (QID) | RESPIRATORY_TRACT | Status: DC
  Administered 2023-08-10: 3 mL via RESPIRATORY_TRACT
  Filled 2023-08-10: qty 3

## 2023-08-10 MED ORDER — ARFORMOTEROL TARTRATE 15 MCG/2ML IN NEBU
15.0000 ug | INHALATION_SOLUTION | Freq: Two times a day (BID) | RESPIRATORY_TRACT | Status: DC
Start: 2023-08-10 — End: 2023-08-28
  Administered 2023-08-10 – 2023-08-27 (×30): 15 ug via RESPIRATORY_TRACT
  Filled 2023-08-10 (×35): qty 2

## 2023-08-10 MED ORDER — SODIUM CHLORIDE 0.9 % IV SOLN: 250.0000 mL | INTRAVENOUS | Status: AC | PRN

## 2023-08-10 MED ORDER — CARVEDILOL 6.25 MG PO TABS
6.2500 mg | ORAL_TABLET | Freq: Two times a day (BID) | ORAL | Status: DC
  Administered 2023-08-10 – 2023-08-25 (×29): 6.25 mg via ORAL
  Filled 2023-08-10 (×29): qty 1

## 2023-08-10 MED ORDER — SODIUM CHLORIDE 0.9% FLUSH
3.0000 mL | Freq: Two times a day (BID) | INTRAVENOUS | Status: DC
Start: 2023-08-10 — End: 2023-08-28
  Administered 2023-08-10 – 2023-08-27 (×23): 3 mL via INTRAVENOUS

## 2023-08-10 MED ORDER — SODIUM CHLORIDE 0.9% FLUSH
3.0000 mL | INTRAVENOUS | Status: DC | PRN
  Administered 2023-08-22: 3 mL via INTRAVENOUS

## 2023-08-10 MED ORDER — LEVALBUTEROL HCL 0.63 MG/3ML IN NEBU
0.6300 mg | INHALATION_SOLUTION | Freq: Four times a day (QID) | RESPIRATORY_TRACT | Status: DC
Start: 2023-08-10 — End: 2023-08-10

## 2023-08-10 MED ORDER — IPRATROPIUM BROMIDE 0.02 % IN SOLN: 0.5000 mg | Freq: Four times a day (QID) | RESPIRATORY_TRACT | Status: DC

## 2023-08-10 MED ORDER — FUROSEMIDE 10 MG/ML IJ SOLN
40.0000 mg | Freq: Every day | INTRAMUSCULAR | Status: DC
  Administered 2023-08-10 – 2023-08-12 (×3): 40 mg via INTRAVENOUS
  Filled 2023-08-10 (×3): qty 4

## 2023-08-10 MED ORDER — IPRATROPIUM BROMIDE 0.02 % IN SOLN
0.5000 mg | Freq: Four times a day (QID) | RESPIRATORY_TRACT | Status: DC
  Administered 2023-08-10 – 2023-08-13 (×11): 0.5 mg via RESPIRATORY_TRACT
  Filled 2023-08-10 (×13): qty 2.5

## 2023-08-10 MED ORDER — LEVALBUTEROL HCL 0.63 MG/3ML IN NEBU: 0.6300 mg | INHALATION_SOLUTION | Freq: Four times a day (QID) | RESPIRATORY_TRACT | Status: DC | PRN

## 2023-08-10 NOTE — Plan of Care (Signed)

## 2023-08-10 NOTE — Progress Notes (Signed)
 [facility]                                                                              Michah Minton, is a 73 y.o. male, DOB - Mar 04, 1951, ONG:[id number] Admit date - 08/09/2023    Outpatient Primary MD for the patient is [name], MD  LOS - 1  days  Chief Complaint  Patient presents with   Fall       Brief summary   Patient is a 73 year old male with history of COPD, CAD status post CABG, HTN, PAD, CA bladder, presented to ER with fall, syncope.  Patient is a poor historian, apparently was last seen normal a day before per ER provider who spoke with the family.  His speech was noted to be abnormal as well.  In the ER, he was found to be short of breath, bilateral diffuse rhonchi.   In ED, creatinine 1.4, lactic acidosis 3.0, CK 750.  Chest x-ray showed new heterogenous opacity overlying the medial aspect of the right upper lung zone concerning for consolidation. CT head C-spine showed no CT evidence of acute intracranial abnormality, no acute fracture or traumatic malalignment of C-spine.  Interlobular septal thickening which could reflect pulmonary edema, emphysema. Left shoulder x-ray showed chronic longstanding rotator cuff tear, mild glenohumeral osteoarthritis MRI brain showed no acute intracranial abnormality.  Age-related simple atrophy with mild chronic small vessel ischemic disease Right upper quadrant ultrasound showed subtle liver surface nodularity concerning for cirrhosis, no discrete focal liver lesion.  Small amount of gallbladder sludge  Assessment & Plan    Principal Problem:   Acute respiratory failure with hypoxia (HCC)  -multifactorial secondary to community-acquired pneumonia, COPD with acute exacerbation, acute CHF/pulmonary edema.  Procalcitonin 0.5 -On 2 L O2 via [place], continue management of PNA, COPD, CHF -DC IV fluids, wean O2 as tolerated  Active Problems:   CAP (community acquired pneumonia) -Obtain urine Legionella antigen,  urine strep antigen -Continue IV Zithromax, Rocephin    COPD with acute exacerbation (HCC) -Continue scheduled DuoNebs, Pulmicort, Brovana, Mucinex -continue IV Solu-Medrol 40 mg every 8 hours, -Continue antibiotics, flutter valve    Acute CHF (congestive heart failure) (HCC) -BNP elevated, 1312 -DC IV fluids, will placed on Lasix 40 mg IV daily, no prior history of CHF however does have history of CAD -Obtain 2D echo.   Lactic acidosis, fall with mild rhabdomyolysis -Patient received IV fluid bolus and continuous fluids in ED, lactic acid was 3.0 on admission, improved to 1.4 -DC IV fluids, CK improved from 750 on admission to 557 today -PT evaluation  Acute kidney injury -Presented with creatinine of 1.4, with lactic acidosis -Patient received IV fluid hydration with bolus and continuous.  -Creatinine improved, 1.1, will DC IV fluids    Acute metabolic encephalopathy -Likely has underlying dementia, B12, folate normal, ammonia level normal, TSH 1.3 -No FND's, MRI of the brain showed no acute intracranial abnormality.  Age-related cerebral atrophy with mild chronic small vessel ischemic disease. -On Geodon prn  Hypertensive urgency -BP elevated, awaiting pharmacy med rec  Hyperlipidemia -Hold statins due to rhabdomyolysis  Estimated body mass index is 23.03 kg/m as calculated  from the following:   Height as of this encounter: 5\' 7"  (1.702 m).   Weight as of this encounter: 66.7 kg.  Code Status: Full CODE STATUS DVT Prophylaxis:  enoxaparin (LOVENOX) injection 40 mg Start: 08/09/23 1815 SCDs Start: 08/09/23 1801   Level of Care: Level of care: Telemetry Medical Family Communication:  Disposition Plan:      Remains inpatient appropriate:      Procedures:    Consultants:     Antimicrobials:   Anti-infectives (From admission, onward)    Start     Dose/Rate Route Frequency Ordered Stop   08/10/23 1700  azithromycin (ZITHROMAX) 250 mg in dextrose 5 % 125 mL  IVPB        250 mg 127.5 mL/hr over 60 Minutes Intravenous Every 24 hours 08/09/23 1759 08/14/23 1659   08/10/23 1000  cefTRIAXone (ROCEPHIN) 1 g in sodium chloride 0.9 % 100 mL IVPB        1 g 200 mL/hr over 30 Minutes Intravenous Every 24 hours 08/09/23 1759 08/14/23 0959   08/09/23 1515  cefTRIAXone (ROCEPHIN) 1 g in sodium chloride 0.9 % 100 mL IVPB        1 g 200 mL/hr over 30 Minutes Intravenous  Once 08/09/23 1504 08/09/23 1622   08/09/23 1515  azithromycin (ZITHROMAX) 500 mg in sodium chloride 0.9 % 250 mL IVPB        500 mg 250 mL/hr over 60 Minutes Intravenous  Once 08/09/23 1504 08/09/23 1803          Medications  arformoterol  15 mcg Nebulization BID   budesonide (PULMICORT) nebulizer solution  0.5 mg Nebulization BID   enoxaparin (LOVENOX) injection  40 mg Subcutaneous Q24H   furosemide  40 mg Intravenous Daily   guaiFENesin  600 mg Oral BID   ipratropium-albuterol  3 mL Nebulization QID   methylPREDNISolone (SOLU-MEDROL) injection  40 mg Intravenous Q8H   sodium chloride flush  3 mL Intravenous Q12H      Subjective:   Crit Gaynor was seen and examined today.  Overnight was agitated and combative trying to get out of the bed.  Geodon was ordered.  No fever chills, chest pain or cough.  No nausea vomiting, still somewhat confused.  Objective:   Vitals:   08/10/23 0437 08/10/23 0500 08/10/23 0733 08/10/23 0740  BP: 137/89   (!) 151/102  Pulse: 81   (!) 102  Resp: 18   18  Temp: (!) 97.2 F (36.2 C)   98 F (36.7 C)  TempSrc: Oral     SpO2: 91%  92% 90%  Weight:  66.7 kg    Height:        Intake/Output Summary (Last 24 hours) at 08/10/2023 1008 Last data filed at 08/09/2023 1803 Gross per 24 hour  Intake 252.11 ml  Output --  Net 252.11 ml     Wt Readings from Last 3 Encounters:  08/10/23 66.7 kg  10/01/14 77.6 kg     Exam General: Alert and oriented x self, NAD on 2 L O2 via [place] Cardiovascular: S1 S2 auscultated,  RRR Respiratory:  Diffuse rhonchi bilaterally Gastrointestinal: Soft, nontender, nondistended, + bowel sounds Ext: no pedal edema bilaterally Neuro: all 4 extremities spontaneously Skin: No rashes Psych: poor insight and judgment    Data Reviewed:  I have personally reviewed following labs    CBC Lab Results  Component Value Date   WBC 8.4 08/10/2023   RBC 4.23 08/10/2023   HGB 15.5 08/10/2023  HCT 47.1 08/10/2023   MCV 111.3 (H) 08/10/2023   MCH 36.6 (H) 08/10/2023   PLT 86 (L) 08/10/2023   MCHC 32.9 08/10/2023   RDW 13.5 08/10/2023     Last metabolic panel Lab Results  Component Value Date   NA 141 08/10/2023   K 4.9 08/10/2023   CL 110 08/10/2023   CO2 19 (L) 08/10/2023   BUN 28 (H) 08/10/2023   CREATININE 1.14 08/10/2023   GLUCOSE 125 (H) 08/10/2023   GFRNONAA >60 08/10/2023   CALCIUM 9.2 08/10/2023   PHOS 3.7 08/10/2023   PROT 6.9 08/10/2023   ALBUMIN 3.0 (L) 08/10/2023   BILITOT 2.1 (H) 08/10/2023   ALKPHOS 78 08/10/2023   AST 52 (H) 08/10/2023   ALT 29 08/10/2023   ANIONGAP 12 08/10/2023    CBG (last 3)  No results for input(s): "GLUCAP" in the last 72 hours.    Coagulation Profile: No results for input(s): "INR", "PROTIME" in the last 168 hours.   Radiology Studies: I have personally reviewed the imaging studies  MR BRAIN WO CONTRAST Result Date: 08/10/2023 CLINICAL DATA:  Initial evaluation for acute mental status change, unknown cause. EXAM: MRI HEAD WITHOUT CONTRAST TECHNIQUE: Multiplanar, multiecho pulse sequences of the brain and surrounding structures were obtained without intravenous contrast. COMPARISON:  CT from 08/09/2023 FINDINGS: Brain: Examination moderately degraded by motion artifact. Generalized age-related cerebral atrophy. Patchy T2/FLAIR hyperintensity involving the periventricular and deep white matter both cerebral hemispheres as well as the pons, consistent with chronic small vessel ischemic disease, mild for age. No evidence for acute or  subacute infarct. Gray-white matter differentiation maintained. No areas of chronic cortical infarction. No acute or chronic intracranial blood products. No mass lesion, midline shift or mass effect. No hydrocephalus or extra-axial fluid collection. Pituitary gland within normal limits. Vascular: Loss of normal flow void within the intradural left V4 segment, which could be related to slow flow and/or occlusion, similar as compared to previous MRI from 11/27/2021. Major intracranial vascular flow voids are otherwise maintained. Skull and upper cervical spine: Craniocervical junction within normal limits. Bone marrow signal intensity normal. No scalp soft tissue abnormality. Sinuses/Orbits: Prior bilateral ocular lens replacement. Chronic mucosal thickening noted throughout the paranasal sinuses. Trace left mastoid effusion, of doubtful significance. Other: None. IMPRESSION: 1. No acute intracranial abnormality. 2. Age-related cerebral atrophy with mild chronic small vessel ischemic disease. 3. Loss of normal flow void within the intradural left V4 segment, which could be related to slow flow and/or occlusion, similar as compared to previous MRI from 11/27/2021. Electronically Signed   By: Rise Mu M.D.   On: 08/10/2023 02:54   US Abdomen Limited RUQ (LIVER/GB) Result Date: 08/09/2023 CLINICAL DATA:  [id number] RUQ abdominal pain [id number]. EXAM: ULTRASOUND ABDOMEN LIMITED RIGHT UPPER QUADRANT COMPARISON:  None Available. FINDINGS: Gallbladder: Physiologically distended. No abnormal wall thickening or pericholecystic free fluid. There is small to moderate amount of sludge. Sonographic Murphy's sign was negative as per the technologist. Common bile duct: Diameter: Up to 4.5 mm.  No intrahepatic bile duct dilation.  None Liver: There is subtle liver surface nodularity, concerning for cirrhosis. There is heterogeneous echotexture. Normal echogenicity. No discrete focal mass seen on the provided images. Portal  vein is patent on color Doppler imaging with normal direction of blood flow towards the liver. Other: None. IMPRESSION: 1. Subtle liver surface nodularity, concerning for cirrhosis. No discrete focal liver lesion seen on the provided images. 2. Small to moderate amount of gallbladder sludge without sonographic evidence of acute  cholecystitis. Electronically Signed   By: Jules Schick M.D.   On: 08/09/2023 16:19   DG Shoulder Left Result Date: 08/09/2023 CLINICAL DATA:  Larey Seat, left shoulder pain EXAM: LEFT SHOULDER - 2+ VIEW COMPARISON:  None Available. FINDINGS: Internal rotation, external rotation, transscapular views of the left shoulder are obtained. No acute displaced fracture. There is marked decrease in the acromial humeral interval consistent with chronic longstanding rotator cuff tear. Mild glenohumeral joint osteoarthritis. Soft tissues are unremarkable. Visualized portions of the left chest are clear. IMPRESSION: 1. Narrowing of the acromial humeral interval, consistent with chronic longstanding rotator cuff tear. 2. Mild glenohumeral osteoarthritis. 3. No acute fracture. Electronically Signed   By: Sharlet Salina M.D.   On: 08/09/2023 15:38   CT Head Wo Contrast Result Date: 08/09/2023 CLINICAL DATA:  Head trauma, found down by EMS. EXAM: CT HEAD WITHOUT CONTRAST CT CERVICAL SPINE WITHOUT CONTRAST TECHNIQUE: Multidetector CT imaging of the head and cervical spine was performed following the standard protocol without intravenous contrast. Multiplanar CT image reconstructions of the cervical spine were also generated. RADIATION DOSE REDUCTION: This exam was performed according to the departmental dose-optimization program which includes automated exposure control, adjustment of the mA and/or kV according to patient size and/or use of iterative reconstruction technique. COMPARISON:  CT head 11/07/2022, CT cervical spine 11/27/2021 FINDINGS: CT HEAD FINDINGS Brain: No acute intracranial hemorrhage. No  CT evidence of acute infarct. Nonspecific hypoattenuation in the periventricular and subcortical white matter favored to reflect chronic microvascular ischemic changes. Mild generalized parenchymal volume loss. No edema, mass effect, or midline shift. The basilar cisterns are patent. Ventricles: Prominence of the ventricles suggesting underlying parenchymal volume loss. Vascular: Atherosclerotic calcifications of the carotid siphons and intracranial vertebral arteries. No hyperdense vessel. Skull: No acute or aggressive finding. Orbits: Orbits are symmetric. Sinuses: Moderate mucosal thickening in the ethmoid sinuses with additional scattered mucosal thickening in the sphenoid sinuses and right frontal sinus. Other: Mastoid air cells are clear. CT CERVICAL SPINE FINDINGS Alignment: Cervical lordosis is maintained. No listhesis. No facet subluxation or dislocation. Skull base and vertebrae: No compression fracture or displaced fracture in the cervical spine. Soft tissues and spinal canal: No prevertebral fluid or swelling. No visible canal hematoma. Prominent atherosclerotic calcifications at the carotid bifurcations. Disc levels: Intervertebral disc space narrowing greatest at C5-6 and C6-7. Disc bulges at multiple levels. Disc osteophyte complex at C6-7 results in mild spinal canal stenosis. Upper chest: Interlobular septal thickening which could reflect pulmonary edema. Biapical pleural-parenchymal scarring. Emphysema. Other: None. IMPRESSION: No CT evidence of acute intracranial abnormality. No acute fracture or traumatic malalignment of the cervical spine. Interlobular septal thickening which could reflect pulmonary edema. Emphysema. Electronically Signed   By: Emily Filbert M.D.   On: 08/09/2023 15:20   CT Cervical Spine Wo Contrast Result Date: 08/09/2023 CLINICAL DATA:  Head trauma, found down by EMS. EXAM: CT HEAD WITHOUT CONTRAST CT CERVICAL SPINE WITHOUT CONTRAST TECHNIQUE: Multidetector CT imaging of  the head and cervical spine was performed following the standard protocol without intravenous contrast. Multiplanar CT image reconstructions of the cervical spine were also generated. RADIATION DOSE REDUCTION: This exam was performed according to the departmental dose-optimization program which includes automated exposure control, adjustment of the mA and/or kV according to patient size and/or use of iterative reconstruction technique. COMPARISON:  CT head 11/07/2022, CT cervical spine 11/27/2021 FINDINGS: CT HEAD FINDINGS Brain: No acute intracranial hemorrhage. No CT evidence of acute infarct. Nonspecific hypoattenuation in the periventricular and subcortical white matter favored  to reflect chronic microvascular ischemic changes. Mild generalized parenchymal volume loss. No edema, mass effect, or midline shift. The basilar cisterns are patent. Ventricles: Prominence of the ventricles suggesting underlying parenchymal volume loss. Vascular: Atherosclerotic calcifications of the carotid siphons and intracranial vertebral arteries. No hyperdense vessel. Skull: No acute or aggressive finding. Orbits: Orbits are symmetric. Sinuses: Moderate mucosal thickening in the ethmoid sinuses with additional scattered mucosal thickening in the sphenoid sinuses and right frontal sinus. Other: Mastoid air cells are clear. CT CERVICAL SPINE FINDINGS Alignment: Cervical lordosis is maintained. No listhesis. No facet subluxation or dislocation. Skull base and vertebrae: No compression fracture or displaced fracture in the cervical spine. Soft tissues and spinal canal: No prevertebral fluid or swelling. No visible canal hematoma. Prominent atherosclerotic calcifications at the carotid bifurcations. Disc levels: Intervertebral disc space narrowing greatest at C5-6 and C6-7. Disc bulges at multiple levels. Disc osteophyte complex at C6-7 results in mild spinal canal stenosis. Upper chest: Interlobular septal thickening which could  reflect pulmonary edema. Biapical pleural-parenchymal scarring. Emphysema. Other: None. IMPRESSION: No CT evidence of acute intracranial abnormality. No acute fracture or traumatic malalignment of the cervical spine. Interlobular septal thickening which could reflect pulmonary edema. Emphysema. Electronically Signed   By: Emily Filbert M.D.   On: 08/09/2023 15:20   DG Pelvis Portable Result Date: 08/09/2023 CLINICAL DATA:  Larey Seat, left hip discomfort EXAM: PORTABLE PELVIS 1-2 VIEWS COMPARISON:  None Available. FINDINGS: Frontal view of the pelvis includes both hips. No fracture, subluxation, or dislocation. Symmetrical bilateral hip osteoarthritis. Extensive atherosclerosis. The sacroiliac joints are normal. IMPRESSION: 1. Bilateral hip osteoarthritis.  No acute fracture. Electronically Signed   By: Sharlet Salina M.D.   On: 08/09/2023 15:01   DG Chest Portable 1 View Result Date: 08/09/2023 CLINICAL DATA:  Fall.  Altered mental status. EXAM: PORTABLE CHEST 1 VIEW COMPARISON:  04/11/2023. FINDINGS: Redemonstration of diffuse increased interstitial markings essentially similar to several prior studies. However, there is new heterogeneous opacity overlying the medial aspect of the right upper lung zone measuring approximately 4 x 4.8 cm, concerning for consolidation. Correlate clinically to determine the need for further imaging with chest CT scan versus follow-up chest radiograph to document resolution. Bilateral lung fields are otherwise clear. Bilateral costophrenic angles are clear. Stable cardio-mediastinal silhouette. No acute osseous abnormalities. The soft tissues are within normal limits. IMPRESSION: *New heterogeneous opacity overlying the medial aspect of the right upper lung zone, concerning for consolidation. Correlate clinically to determine the need for further imaging with chest CT scan versus follow-up chest radiograph to document resolution. Electronically Signed   By: Jules Schick M.D.   On:  08/09/2023 15:00       Ganon Demasi M.D. [facility] 08/10/2023, 10:08 AM  Available via Epic secure chat 7am-7pm After 7 pm, please refer to night coverage provider listed on amion.

## 2023-08-10 NOTE — Progress Notes (Signed)
 Nurse pulled 1 mg of dilaudid and accidentally threw in the sharps container before scanning and giving to patient. Pharmacy was called and made aware. Nurse wasted that dose in the pexis with another nurse [facility]). Nurse pulled another 1 mg and gave to patient as ordered.

## 2023-08-11 ENCOUNTER — Inpatient Hospital Stay (HOSPITAL_COMMUNITY)

## 2023-08-11 DIAGNOSIS — J9601 Acute respiratory failure with hypoxia: Secondary | ICD-10-CM | POA: Diagnosis not present

## 2023-08-11 DIAGNOSIS — I5021 Acute systolic (congestive) heart failure: Secondary | ICD-10-CM | POA: Diagnosis not present

## 2023-08-11 LAB — COMPREHENSIVE METABOLIC PANEL
ALT: 30 U/L (ref 0–44)
AST: 41 U/L (ref 15–41)
Albumin: 2.9 g/dL — ABNORMAL LOW (ref 3.5–5.0)
Alkaline Phosphatase: 76 U/L (ref 38–126)
Anion gap: 5 (ref 5–15)
BUN: 44 mg/dL — ABNORMAL HIGH (ref 8–23)
CO2: 24 mmol/L (ref 22–32)
Calcium: 9.1 mg/dL (ref 8.9–10.3)
Chloride: 114 mmol/L — ABNORMAL HIGH (ref 98–111)
Creatinine, Ser: 1.3 mg/dL — ABNORMAL HIGH (ref 0.61–1.24)
GFR, Estimated: 58 mL/min — ABNORMAL LOW (ref >60)
Glucose, Bld: 128 mg/dL — ABNORMAL HIGH (ref 70–99)
Potassium: 4.7 mmol/L (ref 3.5–5.1)
Sodium: 143 mmol/L (ref 135–145)
Total Bilirubin: 1.6 mg/dL — ABNORMAL HIGH (ref 0.0–1.2)
Total Protein: 6.9 g/dL (ref 6.5–8.1)

## 2023-08-11 LAB — LEGIONELLA PNEUMOPHILA SEROGP 1 UR AG: L. pneumophila Serogp 1 Ur Ag: NEGATIVE

## 2023-08-11 LAB — ECHOCARDIOGRAM COMPLETE
AR max vel: 3.06 cm2
AV Peak grad: 3.8 mmHg
Ao pk vel: 0.97 m/s
Area-P 1/2: 4.57 cm2
Calc EF: 30.7 %
Height: 67 in
MV M vel: 2.02 m/s
MV Peak grad: 16.3 mmHg
S' Lateral: 3.9 cm
Single Plane A2C EF: 22.7 %
Single Plane A4C EF: 30.7 %
Weight: 2352.75 [oz_av]

## 2023-08-11 LAB — CBC
HCT: 47.7 % (ref 39.0–52.0)
Hemoglobin: 15.4 g/dL (ref 13.0–17.0)
MCH: 36.1 pg — ABNORMAL HIGH (ref 26.0–34.0)
MCHC: 32.3 g/dL (ref 30.0–36.0)
MCV: 111.7 fL — ABNORMAL HIGH (ref 80.0–100.0)
Platelets: 91 10*3/uL — ABNORMAL LOW (ref 150–400)
RBC: 4.27 MIL/uL (ref 4.22–5.81)
RDW: 13.3 % (ref 11.5–15.5)
WBC: 7.4 10*3/uL (ref 4.0–10.5)
nRBC: 0 % (ref 0.0–0.2)

## 2023-08-11 LAB — MAGNESIUM: Magnesium: 2.5 mg/dL — ABNORMAL HIGH (ref 1.7–2.4)

## 2023-08-11 MED ORDER — AZITHROMYCIN 500 MG PO TABS
250.0000 mg | ORAL_TABLET | Freq: Every day | ORAL | Status: AC
  Administered 2023-08-11 – 2023-08-13 (×3): 250 mg via ORAL
  Filled 2023-08-11 (×3): qty 1

## 2023-08-11 NOTE — Evaluation (Signed)
 Physical Therapy Evaluation Patient Details Name: Francisco Garcia MRN: [id number] DOB: Apr 11, 1951 Today's Date: 08/11/2023  History of Present Illness  73 year old male presented to ER 3/19 with fall, syncope, Acute respiratory failure with hypoxia, CAP (community acquired pneumonia), COPD with acute exacerbation (HCC), Acute CHF (congestive heart failure), Lactic acidosis, fall with mild rhabdomyolysis. PMHx: COPD, CAD status post CABG, HTN, PAD, CA bladder.  Clinical Impression  Pt admitted with above diagnosis. Limited and questionable hx provided due to confusion. States he is 73 years old, cannot recall birthdate, location, situation, or current year "2002." States he lives alone with no assist available but various people drop food off for him in his apartment. Reports several falls recently. He is currently restless, minimally agitated. Able to rise and sit on edge of bed with moderate assistance. Performed lateral scoots. Unable to redirect to stand, deferred gait assessment for safety. Nasal cannula dislodged when PT entered room, sats 90%, reapplied and improved to low 90s. Patient will benefit from continued inpatient follow up therapy, <3 hours/day.  Pt currently with functional limitations due to the deficits listed below (see PT Problem List). Pt will benefit from acute skilled PT to increase their independence and safety with mobility to allow discharge.           If plan is discharge home, recommend the following: Two people to help with walking and/or transfers;A lot of help with bathing/dressing/bathroom;Assistance with cooking/housework;Direct supervision/assist for medications management;Direct supervision/assist for financial management;Assist for transportation;Supervision due to cognitive status   Can travel by private vehicle   No    Equipment Recommendations None recommended by PT  Recommendations for Other Services       Functional Status Assessment Patient has had  a recent decline in their functional status and demonstrates the ability to make significant improvements in function in a reasonable and predictable amount of time.     Precautions / Restrictions Precautions Precautions: Fall Recall of Precautions/Restrictions: Impaired Precaution/Restrictions Comments: monitor O2. Restrictions Weight Bearing Restrictions Per Provider Order: No      Mobility  Bed Mobility Overal bed mobility: Needs Assistance Bed Mobility: Supine to Sit, Sit to Supine     Supine to sit: Mod assist, HOB elevated Sit to supine: Min assist   General bed mobility comments: Mod assist to help sequence LEs out of bed and support trunk into seated postion. Fear of falling with transition but stabilized at EOB.    Transfers Overall transfer level: Needs assistance Equipment used: 1 person hand held assist Transfers: Bed to chair/wheelchair/BSC            Lateral/Scoot Transfers: Min assist General transfer comment: Could not facilitate full stand but with min assist to help sequence, pt able to lift buttocks multiple times and scoot laterally. Too internally distracted and restless to safely stand.    Ambulation/Gait               General Gait Details: Deferred for safety  Stairs            Wheelchair Mobility     Tilt Bed    Modified Rankin (Stroke Patients Only)       Balance Overall balance assessment: Needs assistance Sitting-balance support: No upper extremity supported, Feet supported Sitting balance-Leahy Scale: Fair                                       Pertinent Vitals/Pain Pain  Assessment Pain Assessment: PAINAD Breathing: normal Negative Vocalization: none Facial Expression: smiling or inexpressive Body Language: tense, distressed pacing, fidgeting Consolability: distracted or reassured by voice/touch PAINAD Score: 2 Pain Intervention(s): Monitored during session    Home Living Family/patient  expects to be discharged to:: Unsure                   Additional Comments: Pt questionable historian and difficult to unserstand due to reduced clarity of speech. Reports he lives on upper level of an apartment and uses the elevator. No family or friends to assist, but has had several falls recently. Has "different people" bring him food.    Prior Function Prior Level of Function : History of Falls (last six months);Patient poor historian/Family not available             Mobility Comments: reports several falls PTA. ADLs Comments: unsure, lives alone     Extremity/Trunk Assessment   Upper Extremity Assessment Upper Extremity Assessment: Defer to OT evaluation    Lower Extremity Assessment Lower Extremity Assessment: Generalized weakness;Difficult to assess due to impaired cognition       Communication   Communication Communication: Impaired Factors Affecting Communication: Difficulty expressing self;Reduced clarity of speech    Cognition Arousal: Alert Behavior During Therapy: Restless   PT - Cognitive impairments: No family/caregiver present to determine baseline, Difficult to assess Difficult to assess due to: Impaired communication                     PT - Cognition Comments: Reduced clarity of speech. Unsure of month/year, birthdate, location or situation. Following commands: Impaired Following commands impaired: Follows one step commands inconsistently, Follows one step commands with increased time     Cueing Cueing Techniques: Verbal cues, Gestural cues, Tactile cues     General Comments General comments (skin integrity, edema, etc.): Nasal cannula hanging on face not applied in nostrals when PT entered room. SpO 90%. Reapplied, increased to low 90s.    Exercises     Assessment/Plan    PT Assessment Patient needs continued PT services  PT Problem List Decreased strength;Decreased activity tolerance;Decreased balance;Decreased  mobility;Decreased cognition;Decreased knowledge of use of DME;Decreased safety awareness;Decreased knowledge of precautions;Cardiopulmonary status limiting activity       PT Treatment Interventions DME instruction;Gait training;Functional mobility training;Therapeutic activities;Therapeutic exercise;Neuromuscular re-education;Balance training;Cognitive remediation;Patient/family education    PT Goals (Current goals can be found in the Care Plan section)  Acute Rehab PT Goals Patient Stated Goal: none stated PT Goal Formulation: Patient unable to participate in goal setting Time For Goal Achievement: 08/25/23 Potential to Achieve Goals: Fair    Frequency Min 2X/week     Co-evaluation               AM-PAC PT "6 Clicks" Mobility  Outcome Measure Help needed turning from your back to your side while in a flat bed without using bedrails?: A Little Help needed moving from lying on your back to sitting on the side of a flat bed without using bedrails?: A Little Help needed moving to and from a bed to a chair (including a wheelchair)?: A Lot Help needed standing up from a chair using your arms (e.g., wheelchair or bedside chair)?: A Lot Help needed to walk in hospital room?: Total Help needed climbing 3-5 steps with a railing? : Total 6 Click Score: 12    End of Session Equipment Utilized During Treatment: Gait belt;Oxygen Activity Tolerance: Other (comment) (Restless, confused.) Patient left: in bed;with  call bell/phone within reach;with bed alarm set (MD visiting pt.)   PT Visit Diagnosis: Repeated falls (R29.6);Muscle weakness (generalized) (M62.81);History of falling (Z91.81);Difficulty in walking, not elsewhere classified (R26.2);Other symptoms and signs involving the nervous system (R29.898)    Time: 4098-1191 PT Time Calculation (min) (ACUTE ONLY): 12 min   Charges:   PT Evaluation $PT Eval Low Complexity: 1 Low   PT General Charges $$ ACUTE PT VISIT: 1 Visit          Kathlyn Sacramento, PT, DPT [facility] Physical Therapist Office: [phone] Website: [email or web address]   Berton Mount 08/11/2023, 5:40 PM

## 2023-08-11 NOTE — Progress Notes (Signed)
 Patient confused, IV pulled out by pt prior to night shift. Per day nurse pt will not keep tele monitor on. During my assessment pt would not allow tele monitor to be put back on. Contacted [facility] Md and pt can be d/c off tele monitoring.

## 2023-08-11 NOTE — Plan of Care (Signed)

## 2023-08-11 NOTE — Progress Notes (Signed)
 [facility]                                                                              Francisco Garcia, is a 73 y.o. male, DOB - 02-26-1951, ATF:[id number] Admit date - 08/09/2023    Outpatient Primary MD for the patient is [name], MD  LOS - 2  days  Chief Complaint  Patient presents with   Fall       Brief summary   Patient is a 73 year old male with history of COPD, CAD status post CABG, HTN, PAD, CA bladder, presented to ER with fall, syncope.  Patient is a poor historian, apparently was last seen normal a day before per ER provider who spoke with the family.  His speech was noted to be abnormal as well.  In the ER, he was found to be short of breath, bilateral diffuse rhonchi.   In ED, creatinine 1.4, lactic acidosis 3.0, CK 750.  Chest x-ray showed new heterogenous opacity overlying the medial aspect of the right upper lung zone concerning for consolidation. CT head C-spine showed no CT evidence of acute intracranial abnormality, no acute fracture or traumatic malalignment of C-spine.  Interlobular septal thickening which could reflect pulmonary edema, emphysema. Left shoulder x-ray showed chronic longstanding rotator cuff tear, mild glenohumeral osteoarthritis MRI brain showed no acute intracranial abnormality.  Age-related simple atrophy with mild chronic small vessel ischemic disease Right upper quadrant ultrasound showed subtle liver surface nodularity concerning for cirrhosis, no discrete focal liver lesion.  Small amount of gallbladder sludge  08/11/2023: Patient seen.  No significant history from patient.  Patient remains cachectic.  Decreased air entry and wheezing noted.  Assessment & Plan    Principal Problem:   Acute respiratory failure with hypoxia (HCC)  -multifactorial secondary to community-acquired pneumonia, COPD with acute exacerbation, acute CHF/pulmonary edema.  Procalcitonin 0.5 -On 2 L O2 via [place], continue management of PNA, COPD,  CHF -DC IV fluids, wean O2 as tolerated 08/11/2023: Continue current regimen.  Active Problems:   CAP (community acquired pneumonia) -Obtain urine Legionella antigen, urine strep antigen -Continue IV Zithromax, Rocephin    COPD with acute exacerbation (HCC) -Continue scheduled DuoNebs, Pulmicort, Brovana, Mucinex -continue IV Solu-Medrol 40 mg every 8 hours, -Continue antibiotics, flutter valve    Acute CHF (congestive heart failure) (HCC) -BNP elevated, 1312 -DC IV fluids, will placed on Lasix 40 mg IV daily, no prior history of CHF however does have history of CAD  Echo revealed: 1. Left ventricular ejection fraction, by estimation, is 30 to 35%. Left  ventricular ejection fraction by 2D MOD biplane is 30.7 %. The left  ventricle has moderately decreased function. The left ventricle  demonstrates global hypokinesis with more severe   inferior hypokinesis to akinesis. There is moderate left ventricular  hypertrophy. Left ventricular diastolic parameters are indeterminate.   2. Right ventricular systolic function is moderately reduced. The right  ventricular size is mildly enlarged. There is moderately elevated  pulmonary artery systolic pressure. The estimated right ventricular  systolic pressure is 45.0 mmHg.   3. Left atrial size was mildly dilated.   4. Right atrial size was severely  dilated.   5. The mitral valve is abnormal. Mild mitral valve regurgitation.   6. The tricuspid valve is abnormal. Tricuspid valve regurgitation is  severe.   7. The aortic valve is tricuspid. Aortic valve regurgitation is not  visualized. Aortic valve sclerosis is present, with no evidence of aortic  valve stenosis.   8. The inferior vena cava is dilated in size with <50% respiratory  variability, suggesting right atrial pressure of 15 mmHg.    Lactic acidosis, fall with mild rhabdomyolysis -Patient received IV fluid bolus and continuous fluids in ED, lactic acid was 3.0 on admission,  improved to 1.4 -DC IV fluids, CK improved from 750 on admission to 557 today -PT evaluation  Acute kidney injury -Presented with creatinine of 1.4, with lactic acidosis -Patient received IV fluid hydration with bolus and continuous.  -Creatinine improved, 1.1, will DC IV fluids    Acute metabolic encephalopathy -Likely has underlying dementia, B12, folate normal, ammonia level normal, TSH 1.3 -No FND's, MRI of the brain showed no acute intracranial abnormality.  Age-related cerebral atrophy with mild chronic small vessel ischemic disease. -On Geodon prn  Hypertensive urgency -BP elevated, awaiting pharmacy med rec  Hyperlipidemia -Hold statins due to rhabdomyolysis  Estimated body mass index is 23.03 kg/m as calculated from the following:   Height as of this encounter: 5\' 7"  (1.702 m).   Weight as of this encounter: 66.7 kg.  Code Status: Full CODE STATUS DVT Prophylaxis:  enoxaparin (LOVENOX) injection 40 mg Start: 08/09/23 1815 SCDs Start: 08/09/23 1801   Level of Care: Level of care: Telemetry Medical Family Communication:  Disposition Plan:      Remains inpatient appropriate:      Procedures:    Consultants:     Antimicrobials:   Anti-infectives (From admission, onward)    Start     Dose/Rate Route Frequency Ordered Stop   08/11/23 1000  azithromycin (ZITHROMAX) tablet 250 mg        250 mg Oral Daily 08/11/23 0852 08/14/23 0959   08/10/23 1700  azithromycin (ZITHROMAX) 250 mg in dextrose 5 % 125 mL IVPB  Status:  Discontinued        250 mg 127.5 mL/hr over 60 Minutes Intravenous Every 24 hours 08/09/23 1759 08/11/23 0852   08/10/23 1000  cefTRIAXone (ROCEPHIN) 1 g in sodium chloride 0.9 % 100 mL IVPB        1 g 200 mL/hr over 30 Minutes Intravenous Every 24 hours 08/09/23 1759 08/14/23 0959   08/09/23 1515  cefTRIAXone (ROCEPHIN) 1 g in sodium chloride 0.9 % 100 mL IVPB        1 g 200 mL/hr over 30 Minutes Intravenous  Once 08/09/23 1504 08/09/23 1622    08/09/23 1515  azithromycin (ZITHROMAX) 500 mg in sodium chloride 0.9 % 250 mL IVPB        500 mg 250 mL/hr over 60 Minutes Intravenous  Once 08/09/23 1504 08/09/23 1803          Medications  arformoterol  15 mcg Nebulization BID   azithromycin  250 mg Oral Daily   budesonide (PULMICORT) nebulizer solution  0.5 mg Nebulization BID   carvedilol  6.25 mg Oral BID WC   enoxaparin (LOVENOX) injection  40 mg Subcutaneous Q24H   furosemide  40 mg Intravenous Daily   guaiFENesin  600 mg Oral BID   ipratropium  0.5 mg Nebulization Q6H   methylPREDNISolone (SOLU-MEDROL) injection  40 mg Intravenous Q8H   sodium chloride flush  3 mL  Intravenous Q12H      Subjective:  No new complaints from patient.  Objective:   Vitals:   08/11/23 0506 08/11/23 0749 08/11/23 0753 08/11/23 1453  BP: (!) 168/112 (!) 168/99    Pulse: 99 86 93 91  Resp: 19 18 20  (!) 22  Temp: 97.7 F (36.5 C) 97.9 F (36.6 C)    TempSrc: Oral     SpO2: 90% 95% 95% 95%  Weight:      Height:        Intake/Output Summary (Last 24 hours) at 08/11/2023 1713 Last data filed at 08/11/2023 0500 Gross per 24 hour  Intake 227.04 ml  Output --  Net 227.04 ml     Wt Readings from Last 3 Encounters:  08/10/23 66.7 kg  10/01/14 77.6 kg     Exam General: Patient is cachectic.  Patient remains confused.  Not in any distress.   Cardiovascular: S1 S2  Respiratory: Decreased air entry with wheeze. Gastrointestinal: Soft and nontender. Ext: no pedal edema bilaterally Neuro: Awake and alert.   Data Reviewed:  I have personally reviewed following labs    CBC Lab Results  Component Value Date   WBC 7.4 08/11/2023   RBC 4.27 08/11/2023   HGB 15.4 08/11/2023   HCT 47.7 08/11/2023   MCV 111.7 (H) 08/11/2023   MCH 36.1 (H) 08/11/2023   PLT 91 (L) 08/11/2023   MCHC 32.3 08/11/2023   RDW 13.3 08/11/2023     Last metabolic panel Lab Results  Component Value Date   NA 143 08/11/2023   K 4.7 08/11/2023   CL  114 (H) 08/11/2023   CO2 24 08/11/2023   BUN 44 (H) 08/11/2023   CREATININE 1.30 (H) 08/11/2023   GLUCOSE 128 (H) 08/11/2023   GFRNONAA 58 (L) 08/11/2023   CALCIUM 9.1 08/11/2023   PHOS 3.7 08/10/2023   PROT 6.9 08/11/2023   ALBUMIN 2.9 (L) 08/11/2023   BILITOT 1.6 (H) 08/11/2023   ALKPHOS 76 08/11/2023   AST 41 08/11/2023   ALT 30 08/11/2023   ANIONGAP 5 08/11/2023    CBG (last 3)  No results for input(s): "GLUCAP" in the last 72 hours.    Coagulation Profile: No results for input(s): "INR", "PROTIME" in the last 168 hours.   Radiology Studies: I have personally reviewed the imaging studies  ECHOCARDIOGRAM COMPLETE Result Date: 08/11/2023    ECHOCARDIOGRAM REPORT   Patient Name:   JADAN HINOJOS Date of Exam: 08/11/2023 Medical Rec #:  [id number]        Height:       67.0 in Accession #:    [id number]       Weight:       147.0 lb Date of Birth:  1951/01/01        BSA:          1.774 m Patient Age:    73 years         BP:           168/112 mmHg Patient Gender: M                HR:           87 bpm. Exam Location:  Inpatient Procedure: 2D Echo, Cardiac Doppler and Color Doppler (Both Spectral and Color            Flow Doppler were utilized during procedure). Indications:    CHF I50.9  History:        Patient has no prior history of  Echocardiogram examinations.                 CHF, COPD; Risk Factors:Hypertension.  Sonographer:    Lucendia Herrlich RCS Referring Phys: [phone] RIPUDEEP K RAI  Sonographer Comments: Image acquisition challenging due to uncooperative patient. IMPRESSIONS  1. Left ventricular ejection fraction, by estimation, is 30 to 35%. Left ventricular ejection fraction by 2D MOD biplane is 30.7 %. The left ventricle has moderately decreased function. The left ventricle demonstrates global hypokinesis with more severe  inferior hypokinesis to akinesis. There is moderate left ventricular hypertrophy. Left ventricular diastolic parameters are indeterminate.  2. Right ventricular  systolic function is moderately reduced. The right ventricular size is mildly enlarged. There is moderately elevated pulmonary artery systolic pressure. The estimated right ventricular systolic pressure is 45.0 mmHg.  3. Left atrial size was mildly dilated.  4. Right atrial size was severely dilated.  5. The mitral valve is abnormal. Mild mitral valve regurgitation.  6. The tricuspid valve is abnormal. Tricuspid valve regurgitation is severe.  7. The aortic valve is tricuspid. Aortic valve regurgitation is not visualized. Aortic valve sclerosis is present, with no evidence of aortic valve stenosis.  8. The inferior vena cava is dilated in size with <50% respiratory variability, suggesting right atrial pressure of 15 mmHg. Comparison(s): No prior Echocardiogram. FINDINGS  Left Ventricle: Left ventricular ejection fraction, by estimation, is 30 to 35%. Left ventricular ejection fraction by 2D MOD biplane is 30.7 %. The left ventricle has moderately decreased function. The left ventricle demonstrates global hypokinesis. The left ventricular internal cavity size was normal in size. There is moderate left ventricular hypertrophy. Left ventricular diastolic parameters are indeterminate. Right Ventricle: The right ventricular size is mildly enlarged. No increase in right ventricular wall thickness. Right ventricular systolic function is moderately reduced. There is moderately elevated pulmonary artery systolic pressure. The tricuspid regurgitant velocity is 2.74 m/s, and with an assumed right atrial pressure of 15 mmHg, the estimated right ventricular systolic pressure is 45.0 mmHg. Left Atrium: Left atrial size was mildly dilated. Right Atrium: Right atrial size was severely dilated. Pericardium: There is no evidence of pericardial effusion. Mitral Valve: The mitral valve is abnormal. Mild mitral valve regurgitation. Tricuspid Valve: The tricuspid valve is abnormal. Tricuspid valve regurgitation is severe. The flow in the  hepatic veins is reversed during ventricular systole. Aortic Valve: The aortic valve is tricuspid. Aortic valve regurgitation is not visualized. Aortic valve sclerosis is present, with no evidence of aortic valve stenosis. Aortic valve peak gradient measures 3.8 mmHg. Pulmonic Valve: The pulmonic valve was normal in structure. Pulmonic valve regurgitation is not visualized. No evidence of pulmonic stenosis. Aorta: The aortic root and ascending aorta are structurally normal, with no evidence of dilitation. Venous: The inferior vena cava is dilated in size with less than 50% respiratory variability, suggesting right atrial pressure of 15 mmHg. IAS/Shunts: No atrial level shunt detected by color flow Doppler.  LEFT VENTRICLE PLAX 2D                        Biplane EF (MOD) LVIDd:         4.20 cm         LV Biplane EF:   Left LVIDs:         3.90 cm                          ventricular LV PW:  1.20 cm                          ejection LV IVS:        1.30 cm                          fraction by LVOT diam:     2.30 cm                          2D MOD LV SV:         41                               biplane is LV SV Index:   23                               30.7 %. LVOT Area:     4.15 cm                                Diastology                                LV e' medial:    8.67 cm/s LV Volumes (MOD)               LV E/e' medial:  10.0 LV vol d, MOD    63.8 ml       LV e' lateral:   12.70 cm/s A2C:                           LV E/e' lateral: 6.8 LV vol d, MOD    70.4 ml A4C: LV vol s, MOD    49.3 ml A2C: LV vol s, MOD    48.8 ml A4C: LV SV MOD A2C:   14.5 ml LV SV MOD A4C:   70.4 ml LV SV MOD BP:    21.7 ml RIGHT VENTRICLE            IVC RV S prime:     6.83 cm/s  IVC diam: 2.70 cm TAPSE (M-mode): 1.4 cm LEFT ATRIUM             Index        RIGHT ATRIUM           Index LA diam:        4.70 cm 2.65 cm/m   RA Area:     29.10 cm LA Vol (A2C):   64.9 ml 36.57 ml/m  RA Volume:   100.00 ml 56.36 ml/m LA Vol (A4C):    59.0 ml 33.25 ml/m LA Biplane Vol: 63.6 ml 35.84 ml/m  AORTIC VALVE AV Area (Vmax): 3.06 cm AV Vmax:        97.10 cm/s AV Peak Grad:   3.8 mmHg LVOT Vmax:      71.43 cm/s LVOT Vmean:     46.100 cm/s LVOT VTI:       0.099 m  AORTA Ao Root diam: 3.10 cm Ao Asc diam:  3.40 cm MITRAL VALVE               TRICUSPID VALVE  MV Area (PHT): 4.57 cm    TR Peak grad:   30.0 mmHg MV Decel Time: 166 msec    TR Vmax:        274.00 cm/s MR Peak grad: 16.3 mmHg MR Vmax:      202.00 cm/s  SHUNTS MV E velocity: 86.50 cm/s  Systemic VTI:  0.10 m MV A velocity: 49.80 cm/s  Systemic Diam: 2.30 cm MV E/A ratio:  1.74 Zoila Shutter MD Electronically signed by Zoila Shutter MD Signature Date/Time: 08/11/2023/12:16:14 PM    Final    MR BRAIN WO CONTRAST Result Date: 08/10/2023 CLINICAL DATA:  Initial evaluation for acute mental status change, unknown cause. EXAM: MRI HEAD WITHOUT CONTRAST TECHNIQUE: Multiplanar, multiecho pulse sequences of the brain and surrounding structures were obtained without intravenous contrast. COMPARISON:  CT from 08/09/2023 FINDINGS: Brain: Examination moderately degraded by motion artifact. Generalized age-related cerebral atrophy. Patchy T2/FLAIR hyperintensity involving the periventricular and deep white matter both cerebral hemispheres as well as the pons, consistent with chronic small vessel ischemic disease, mild for age. No evidence for acute or subacute infarct. Gray-white matter differentiation maintained. No areas of chronic cortical infarction. No acute or chronic intracranial blood products. No mass lesion, midline shift or mass effect. No hydrocephalus or extra-axial fluid collection. Pituitary gland within normal limits. Vascular: Loss of normal flow void within the intradural left V4 segment, which could be related to slow flow and/or occlusion, similar as compared to previous MRI from 11/27/2021. Major intracranial vascular flow voids are otherwise maintained. Skull and upper cervical spine:  Craniocervical junction within normal limits. Bone marrow signal intensity normal. No scalp soft tissue abnormality. Sinuses/Orbits: Prior bilateral ocular lens replacement. Chronic mucosal thickening noted throughout the paranasal sinuses. Trace left mastoid effusion, of doubtful significance. Other: None. IMPRESSION: 1. No acute intracranial abnormality. 2. Age-related cerebral atrophy with mild chronic small vessel ischemic disease. 3. Loss of normal flow void within the intradural left V4 segment, which could be related to slow flow and/or occlusion, similar as compared to previous MRI from 11/27/2021. Electronically Signed   By: Rise Mu M.D.   On: 08/10/2023 02:54     Time spent: 35 minutes.  Barnetta Chapel M.D. [facility] 08/11/2023, 5:13 PM  Available via Epic secure chat 7am-7pm After 7 pm, please refer to night coverage provider listed on amion.

## 2023-08-11 NOTE — Progress Notes (Addendum)
 [facility] night cross cover note:   I was notified by RN that the patient is refusing telemetry, continuing to pull off telemetry leads.  I subsequently discontinued telemetry monitoring overnight.   Additionally, peripheral IV access has been lost. Working to re-establish IV access, including consult to IV team, but at this time, patient is without PIV access. In the setting of his presenting acute COPD exacerbation, he is due for his next dose of solumedrol 40 mg IV 3 times daily.  In light of the above, in the setting of no current IV access, will hold this current dose of solumedrol, and I have placed an order for a one-time dose of prednisone, with plan to resume solumedrol during day shift once IV access has been re-established.     Newton Pigg, DO Hospitalist

## 2023-08-11 NOTE — Care Management (Signed)
 Transition of Care [facility]) - Inpatient Brief Assessment   Patient Details  Name: Francisco Garcia MRN: [id number] Date of Birth: [birth year]  Transition of Care [facility]) CM/SW Contact:    Lockie Pares, RN Phone Number: 08/11/2023, 1:32 PM   Clinical Narrative: Patient presents to ED after a call from daughter to him. He resides at [facility]. He was somewhat disheveled upon arrival. He is requiring 2-3 L of oxygen. PT consult placed awaiting evaluation for recommendations.  TOC will follow for needs, recommendations, and transitions of care.    Transition of Care Asessment: Insurance and Status: Insurance coverage has been reviewed Patient has primary care physician: Yes Home environment has been reviewed: Lives in apartment in senior living apt Prior level of function:: Independent Prior/Current Home Services: No current home services Social Drivers of Health Review: SDOH reviewed no interventions necessary (Patient did not want to answer) Readmission risk has been reviewed: Yes Transition of care needs: transition of care needs identified, TOC will continue to follow

## 2023-08-11 NOTE — Care Management Important Message (Signed)
 Important Message  Patient Details  Name: Francisco Garcia MRN: [id number] Date of Birth: [birth year]   Important Message Given:  Yes - Medicare IM     Dorena Bodo 08/11/2023, 2:32 PM

## 2023-08-12 DIAGNOSIS — I5021 Acute systolic (congestive) heart failure: Secondary | ICD-10-CM | POA: Diagnosis not present

## 2023-08-12 DIAGNOSIS — J9601 Acute respiratory failure with hypoxia: Secondary | ICD-10-CM | POA: Diagnosis not present

## 2023-08-12 LAB — CBC
HCT: 50.7 % (ref 39.0–52.0)
Hemoglobin: 16.5 g/dL (ref 13.0–17.0)
MCH: 36.2 pg — ABNORMAL HIGH (ref 26.0–34.0)
MCHC: 32.5 g/dL (ref 30.0–36.0)
MCV: 111.2 fL — ABNORMAL HIGH (ref 80.0–100.0)
Platelets: 116 10*3/uL — ABNORMAL LOW (ref 150–400)
RBC: 4.56 MIL/uL (ref 4.22–5.81)
RDW: 13.2 % (ref 11.5–15.5)
WBC: 7.8 10*3/uL (ref 4.0–10.5)
nRBC: 0 % (ref 0.0–0.2)

## 2023-08-12 LAB — RENAL FUNCTION PANEL
Albumin: 3.3 g/dL — ABNORMAL LOW (ref 3.5–5.0)
Anion gap: 9 (ref 5–15)
BUN: 57 mg/dL — ABNORMAL HIGH (ref 8–23)
CO2: 29 mmol/L (ref 22–32)
Calcium: 10.1 mg/dL (ref 8.9–10.3)
Chloride: 110 mmol/L (ref 98–111)
Creatinine, Ser: 1.35 mg/dL — ABNORMAL HIGH (ref 0.61–1.24)
GFR, Estimated: 55 mL/min — ABNORMAL LOW (ref >60)
Glucose, Bld: 117 mg/dL — ABNORMAL HIGH (ref 70–99)
Phosphorus: 3.1 mg/dL (ref 2.5–4.6)
Potassium: 4 mmol/L (ref 3.5–5.1)
Sodium: 148 mmol/L — ABNORMAL HIGH (ref 135–145)

## 2023-08-12 LAB — HEPATIC FUNCTION PANEL
ALT: 31 U/L (ref 0–44)
AST: 36 U/L (ref 15–41)
Albumin: 3.3 g/dL — ABNORMAL LOW (ref 3.5–5.0)
Alkaline Phosphatase: 82 U/L (ref 38–126)
Bilirubin, Direct: 0.4 mg/dL — ABNORMAL HIGH (ref 0.0–0.2)
Indirect Bilirubin: 1.1 mg/dL — ABNORMAL HIGH (ref 0.3–0.9)
Total Bilirubin: 1.5 mg/dL — ABNORMAL HIGH (ref 0.0–1.2)
Total Protein: 7.3 g/dL (ref 6.5–8.1)

## 2023-08-12 LAB — CK: Total CK: 136 U/L (ref 49–397)

## 2023-08-12 LAB — MAGNESIUM: Magnesium: 2.6 mg/dL — ABNORMAL HIGH (ref 1.7–2.4)

## 2023-08-12 MED ORDER — NIFEDIPINE ER OSMOTIC RELEASE 60 MG PO TB24
60.0000 mg | ORAL_TABLET | Freq: Every day | ORAL | Status: DC
  Administered 2023-08-12 – 2023-08-26 (×12): 60 mg via ORAL
  Filled 2023-08-12 (×15): qty 1

## 2023-08-12 MED ORDER — PREDNISONE 20 MG PO TABS
40.0000 mg | ORAL_TABLET | Freq: Once | ORAL | Status: AC
  Administered 2023-08-12: 40 mg via ORAL
  Filled 2023-08-12: qty 2

## 2023-08-12 NOTE — Plan of Care (Signed)

## 2023-08-12 NOTE — Consult Note (Signed)
 Cardiology Consultation   Patient ID: Francisco Garcia MRN: [id number]; DOB: 10-Feb-1951  Admit date: 08/09/2023 Date of Consult: 08/12/2023  PCP:  [name], MD   [facility] Providers Cardiologist:  None   has seen [facility] in the past, 3 years ago   Patient Profile:   Francisco Garcia is a 73 y.o. male with a hx of CAD CABG who is being seen 08/12/2023 for the evaluation of EF 30 to 35% reduced from 60 at the request of Dr. Dartha Lodge.  History of Present Illness:   Francisco Garcia 73 year old male with coronary disease status post CABG 2006 with paroxysmal atrial fibrillation history of TIA, memory impairment, carotid stenosis, hyperlipidemia, hypertension, peripheral vascular disease.  As cardiology note able to be viewed was from the year 2020.  He was in normal sinus rhythm at the time on amiodarone and Eliquis had been taking Plavix at that time as well amlodipine 10 mg atorvastatin 20 mg Eliquis 5 mg twice a day losartan 100 mg a day hydrochlorothiazide 25 mg a day.  He had a stress test at that time in 2020 that showed no ischemia with EF of 67% echocardiogram in 2020 showed EF 65 to 70% with moderate LVH right atrial enlargement  He was admitted here with fall and acute mental status changes he had slight lactic acidosis mild rhabdo trauma workup was negative in the ER MRI unremarkable for any acute abnormalities he was only able to answer basic questions creatinine was 1.4 potassium 3.9 ALT 22 CK total was 715.  Unable to obtain a history.  Currently he is laying in bed ill-appearing disheveled with mittens moaning not alert and oriented.  History reviewed. No pertinent past medical history.  History reviewed. No pertinent surgical history.   Home Medications:  Prior to Admission medications   Medication Sig Start Date End Date Taking? Authorizing Provider  amLODipine (NORVASC) 10 MG tablet Take 10 mg by mouth in the morning. 09/18/14  Yes [provider]   atorvastatin (LIPITOR) 20 MG tablet Take 20 mg by mouth at bedtime. 07/17/14  Yes [provider]  omeprazole (PRILOSEC) 40 MG capsule Take 40 mg by mouth daily. 09/07/15  Yes [provider]  PROAIR HFA 108 (90 BASE) MCG/ACT inhaler Inhale 1-2 puffs into the lungs every 6 (six) hours as needed for wheezing or shortness of breath. 07/24/14  Yes [provider]  Skin Protectants, Misc. (EUCERIN) cream Apply topically as needed for wound care. Patient taking differently: Apply 1 Application topically daily as needed for wound care. 03/31/15  Yes Sheard, Myeong O, DPM  Vitamin D, Ergocalciferol, (DRISDOL) 1.25 MG (50000 UNIT) CAPS capsule Take 50,000 Units by mouth every 7 (seven) days. 09/04/21  Yes [provider]  NITROSTAT 0.4 MG SL tablet Place 0.4 mg under the tongue every 5 (five) minutes as needed for chest pain. 09/03/14   [provider]    Inpatient Medications: Scheduled Meds:  arformoterol  15 mcg Nebulization BID   azithromycin  250 mg Oral Daily   budesonide (PULMICORT) nebulizer solution  0.5 mg Nebulization BID   carvedilol  6.25 mg Oral BID WC   enoxaparin (LOVENOX) injection  40 mg Subcutaneous Q24H   guaiFENesin  600 mg Oral BID   ipratropium  0.5 mg Nebulization Q6H   methylPREDNISolone (SOLU-MEDROL) injection  40 mg Intravenous Q8H   NIFEdipine  60 mg Oral Daily   sodium chloride flush  3 mL Intravenous Q12H   Continuous Infusions:  cefTRIAXone (ROCEPHIN)  IV 1 g (08/12/23 0921)   PRN Meds: acetaminophen **OR** acetaminophen, bisacodyl, glucagon (human recombinant), guaiFENesin, hydrALAZINE, HYDROmorphone (DILAUDID) injection, levalbuterol **AND** ipratropium, metoprolol tartrate, nicotine, ondansetron **OR** ondansetron (ZOFRAN) IV, oxyCODONE, senna-docusate, sodium chloride flush, sodium phosphate, ziprasidone  Allergies:    Allergies  Allergen Reactions   Aspirin     unknown   Penicillins     unknown    Social History:    Social History   Socioeconomic History   Marital status: Single    Spouse name: Not on file   Number of children: Not on file   Years of education: Not on file   Highest education level: Not on file  Occupational History   Not on file  Tobacco Use   Smoking status: Former    Current packs/day: 0.00    Types: Cigarettes    Quit date: 03/07/2015    Years since quitting: 8.4   Smokeless tobacco: Never  Substance and Sexual Activity   Alcohol use: Not on file   Drug use: Not on file   Sexual activity: Not on file  Other Topics Concern   Not on file  Social History Narrative   Not on file   Social Drivers of Health   Financial Resource Strain: Not on file  Food Insecurity: Patient Unable To Answer (08/09/2023)   Hunger Vital Sign    Worried About Running Out of Food in the Last Year: Patient unable to answer    Ran Out of Food in the Last Year: Patient unable to answer  Transportation Needs: Patient Unable To Answer (08/09/2023)   PRAPARE - Transportation    Lack of Transportation (Medical): Patient unable to answer    Lack of Transportation (Non-Medical): Patient unable to answer  Physical Activity: Not on file  Stress: Not on file  Social Connections: Not on file  Intimate Partner Violence: Patient Unable To Answer (08/09/2023)   Humiliation, Afraid, Rape, and Kick questionnaire    Fear of Current or Ex-Partner: Patient unable to answer    Emotionally Abused: Patient unable to answer    Physically Abused: Patient unable to answer    Sexually Abused: Patient unable to answer    Family History:   History reviewed. No pertinent family history.   ROS:  Please see the history of present illness.   All other ROS reviewed and negative.     Physical Exam/Data:   Vitals:   08/12/23 0510 08/12/23 0618 08/12/23 0744 08/12/23 1309  BP: (!) 174/113     Pulse: 90  89 77  Resp: 18  20 (!) 21  Temp: 97.8 F (36.6 C)     TempSrc: Oral     SpO2: (!) 87% 91% 93% 95%   Weight:      Height:        Intake/Output Summary (Last 24 hours) at 08/12/2023 1350 Last data filed at 08/12/2023 0656 Gross per 24 hour  Intake 120 ml  Output 200 ml  Net -80 ml      08/12/2023    5:00 AM 08/10/2023    5:00 AM 08/09/2023   12:26 PM  Last 3 Weights  Weight (lbs) 136 lb 0.4 oz 147 lb 0.8 oz 169 lb 12.1 oz  Weight (kg) 61.7 kg 66.7 kg 77 kg     Body mass index is 21.3 kg/m.  General: Confused at baseline apparently from family members, currently ill-appearing disheveled mittens unable to follow commands HEENT: normal Neck: no JVD Vascular: No carotid bruits; Distal pulses 2+  bilaterally Cardiac:  normal S1, S2; irregularly irregular; no murmur  Lungs: Coughing, normal respiratory effort  abd: soft, nontender, no hepatomegaly  Ext: no edema, thin Musculoskeletal:  No deformities, BUE and BLE strength normal and equal Skin: warm and dry  Neuro: Moving extremities Psych: Obtunded  EKG:  The EKG on arrival March 19 was personally reviewed and demonstrates: Atrial fibrillation 108 bpm with right bundle branch block, PVCs   Relevant CV Studies: Echo showed ejection fraction 30 to 35% pulmonary pressure 45 mmHg severely dilated right [facility] mildly dilated left [facility] vena cava dilated  Laboratory Data:  High Sensitivity Troponin:  No results for input(s): "TROPONINIHS" in the last 720 hours.   Chemistry Recent Labs  Lab 08/10/23 0733 08/11/23 0601 08/12/23 0634  NA 141 143 148*  K 4.9 4.7 4.0  CL 110 114* 110  CO2 19* 24 29  GLUCOSE 125* 128* 117*  BUN 28* 44* 57*  CREATININE 1.14 1.30* 1.35*  CALCIUM 9.2 9.1 10.1  MG 2.4 2.5* 2.6*  GFRNONAA >60 58* 55*  ANIONGAP 12 5 9     Recent Labs  Lab 08/10/23 0733 08/11/23 0601 08/12/23 0634  PROT 6.9 6.9 7.3  ALBUMIN 3.0* 2.9* 3.3*  3.3*  AST 52* 41 36  ALT 29 30 31   ALKPHOS 78 76 82  BILITOT 2.1* 1.6* 1.5*   Lipids No results for input(s): "CHOL", "TRIG", "HDL", "LABVLDL", "LDLCALC", "CHOLHDL"  in the last 168 hours.  Hematology Recent Labs  Lab 08/10/23 0733 08/11/23 0601 08/12/23 0634  WBC 8.4 7.4 7.8  RBC 4.23 4.27 4.56  HGB 15.5 15.4 16.5  HCT 47.1 47.7 50.7  MCV 111.3* 111.7* 111.2*  MCH 36.6* 36.1* 36.2*  MCHC 32.9 32.3 32.5  RDW 13.5 13.3 13.2  PLT 86* 91* 116*   Thyroid  Recent Labs  Lab 08/09/23 1857  TSH 1.331    BNP Recent Labs  Lab 08/10/23 0733  BNP 1,312.4*    DDimer No results for input(s): "DDIMER" in the last 168 hours.   Radiology/Studies:  ECHOCARDIOGRAM COMPLETE Result Date: 08/11/2023    ECHOCARDIOGRAM REPORT   Patient Name:   ALECZANDER Belcastro Date of Exam: 08/11/2023 Medical Rec #:  [id number]        Height:       67.0 in Accession #:    [id number]       Weight:       147.0 lb Date of Birth:  20-Nov-1950        BSA:          1.774 m Patient Age:    73 years         BP:           168/112 mmHg Patient Gender: M                HR:           87 bpm. Exam Location:  Inpatient Procedure: 2D Echo, Cardiac Doppler and Color Doppler (Both Spectral and Color            Flow Doppler were utilized during procedure). Indications:    CHF I50.9  History:        Patient has no prior history of Echocardiogram examinations.                 CHF, COPD; Risk Factors:Hypertension.  Sonographer:    Lucendia Herrlich RCS Referring Phys: [phone] RIPUDEEP K RAI  Sonographer Comments: Image acquisition challenging due to uncooperative patient. IMPRESSIONS  1.  Left ventricular ejection fraction, by estimation, is 30 to 35%. Left ventricular ejection fraction by 2D MOD biplane is 30.7 %. The left ventricle has moderately decreased function. The left ventricle demonstrates global hypokinesis with more severe  inferior hypokinesis to akinesis. There is moderate left ventricular hypertrophy. Left ventricular diastolic parameters are indeterminate.  2. Right ventricular systolic function is moderately reduced. The right ventricular size is mildly enlarged. There is moderately elevated  pulmonary artery systolic pressure. The estimated right ventricular systolic pressure is 45.0 mmHg.  3. Left atrial size was mildly dilated.  4. Right atrial size was severely dilated.  5. The mitral valve is abnormal. Mild mitral valve regurgitation.  6. The tricuspid valve is abnormal. Tricuspid valve regurgitation is severe.  7. The aortic valve is tricuspid. Aortic valve regurgitation is not visualized. Aortic valve sclerosis is present, with no evidence of aortic valve stenosis.  8. The inferior vena cava is dilated in size with <50% respiratory variability, suggesting right atrial pressure of 15 mmHg. Comparison(s): No prior Echocardiogram. FINDINGS  Left Ventricle: Left ventricular ejection fraction, by estimation, is 30 to 35%. Left ventricular ejection fraction by 2D MOD biplane is 30.7 %. The left ventricle has moderately decreased function. The left ventricle demonstrates global hypokinesis. The left ventricular internal cavity size was normal in size. There is moderate left ventricular hypertrophy. Left ventricular diastolic parameters are indeterminate. Right Ventricle: The right ventricular size is mildly enlarged. No increase in right ventricular wall thickness. Right ventricular systolic function is moderately reduced. There is moderately elevated pulmonary artery systolic pressure. The tricuspid regurgitant velocity is 2.74 m/s, and with an assumed right atrial pressure of 15 mmHg, the estimated right ventricular systolic pressure is 45.0 mmHg. Left [facility]: Left atrial size was mildly dilated. Right [facility]: Right atrial size was severely dilated. Pericardium: There is no evidence of pericardial effusion. Mitral Valve: The mitral valve is abnormal. Mild mitral valve regurgitation. Tricuspid Valve: The tricuspid valve is abnormal. Tricuspid valve regurgitation is severe. The flow in the hepatic veins is reversed during ventricular systole. Aortic Valve: The aortic valve is tricuspid. Aortic valve  regurgitation is not visualized. Aortic valve sclerosis is present, with no evidence of aortic valve stenosis. Aortic valve peak gradient measures 3.8 mmHg. Pulmonic Valve: The pulmonic valve was normal in structure. Pulmonic valve regurgitation is not visualized. No evidence of pulmonic stenosis. Aorta: The aortic root and ascending aorta are structurally normal, with no evidence of dilitation. Venous: The inferior vena cava is dilated in size with less than 50% respiratory variability, suggesting right atrial pressure of 15 mmHg. IAS/Shunts: No atrial level shunt detected by color flow Doppler.  LEFT VENTRICLE PLAX 2D                        Biplane EF (MOD) LVIDd:         4.20 cm         LV Biplane EF:   Left LVIDs:         3.90 cm                          ventricular LV PW:         1.20 cm                          ejection LV IVS:        1.30 cm  fraction by LVOT diam:     2.30 cm                          2D MOD LV SV:         41                               biplane is LV SV Index:   23                               30.7 %. LVOT Area:     4.15 cm                                Diastology                                LV e' medial:    8.67 cm/s LV Volumes (MOD)               LV E/e' medial:  10.0 LV vol d, MOD    63.8 ml       LV e' lateral:   12.70 cm/s A2C:                           LV E/e' lateral: 6.8 LV vol d, MOD    70.4 ml A4C: LV vol s, MOD    49.3 ml A2C: LV vol s, MOD    48.8 ml A4C: LV SV MOD A2C:   14.5 ml LV SV MOD A4C:   70.4 ml LV SV MOD BP:    21.7 ml RIGHT VENTRICLE            IVC RV S prime:     6.83 cm/s  IVC diam: 2.70 cm TAPSE (M-mode): 1.4 cm LEFT ATRIUM             Index        RIGHT ATRIUM           Index LA diam:        4.70 cm 2.65 cm/m   RA Area:     29.10 cm LA Vol (A2C):   64.9 ml 36.57 ml/m  RA Volume:   100.00 ml 56.36 ml/m LA Vol (A4C):   59.0 ml 33.25 ml/m LA Biplane Vol: 63.6 ml 35.84 ml/m  AORTIC VALVE AV Area (Vmax): 3.06 cm AV Vmax:         97.10 cm/s AV Peak Grad:   3.8 mmHg LVOT Vmax:      71.43 cm/s LVOT Vmean:     46.100 cm/s LVOT VTI:       0.099 m  AORTA Ao Root diam: 3.10 cm Ao Asc diam:  3.40 cm MITRAL VALVE               TRICUSPID VALVE MV Area (PHT): 4.57 cm    TR Peak grad:   30.0 mmHg MV Decel Time: 166 msec    TR Vmax:        274.00 cm/s MR Peak grad: 16.3 mmHg MR Vmax:      202.00 cm/s  SHUNTS MV E velocity: 86.50 cm/s  Systemic VTI:  0.10 m MV A velocity:  49.80 cm/s  Systemic Diam: 2.30 cm MV E/A ratio:  1.74 Zoila Shutter MD Electronically signed by Zoila Shutter MD Signature Date/Time: 08/11/2023/12:16:14 PM    Final    MR BRAIN WO CONTRAST Result Date: 08/10/2023 CLINICAL DATA:  Initial evaluation for acute mental status change, unknown cause. EXAM: MRI HEAD WITHOUT CONTRAST TECHNIQUE: Multiplanar, multiecho pulse sequences of the brain and surrounding structures were obtained without intravenous contrast. COMPARISON:  CT from 08/09/2023 FINDINGS: Brain: Examination moderately degraded by motion artifact. Generalized age-related cerebral atrophy. Patchy T2/FLAIR hyperintensity involving the periventricular and deep white matter both cerebral hemispheres as well as the pons, consistent with chronic small vessel ischemic disease, mild for age. No evidence for acute or subacute infarct. Gray-white matter differentiation maintained. No areas of chronic cortical infarction. No acute or chronic intracranial blood products. No mass lesion, midline shift or mass effect. No hydrocephalus or extra-axial fluid collection. Pituitary gland within normal limits. Vascular: Loss of normal flow void within the intradural left V4 segment, which could be related to slow flow and/or occlusion, similar as compared to previous MRI from 11/27/2021. Major intracranial vascular flow voids are otherwise maintained. Skull and upper cervical spine: Craniocervical junction within normal limits. Bone marrow signal intensity normal. No scalp soft tissue  abnormality. Sinuses/Orbits: Prior bilateral ocular lens replacement. Chronic mucosal thickening noted throughout the paranasal sinuses. Trace left mastoid effusion, of doubtful significance. Other: None. IMPRESSION: 1. No acute intracranial abnormality. 2. Age-related cerebral atrophy with mild chronic small vessel ischemic disease. 3. Loss of normal flow void within the intradural left V4 segment, which could be related to slow flow and/or occlusion, similar as compared to previous MRI from 11/27/2021. Electronically Signed   By: Rise Mu M.D.   On: 08/10/2023 02:54   US Abdomen Limited RUQ (LIVER/GB) Result Date: 08/09/2023 CLINICAL DATA:  [id number] RUQ abdominal pain [id number]. EXAM: ULTRASOUND ABDOMEN LIMITED RIGHT UPPER QUADRANT COMPARISON:  None Available. FINDINGS: Gallbladder: Physiologically distended. No abnormal wall thickening or pericholecystic free fluid. There is small to moderate amount of sludge. Sonographic Murphy's sign was negative as per the technologist. Common bile duct: Diameter: Up to 4.5 mm.  No intrahepatic bile duct dilation.  None Liver: There is subtle liver surface nodularity, concerning for cirrhosis. There is heterogeneous echotexture. Normal echogenicity. No discrete focal mass seen on the provided images. Portal vein is patent on color Doppler imaging with normal direction of blood flow towards the liver. Other: None. IMPRESSION: 1. Subtle liver surface nodularity, concerning for cirrhosis. No discrete focal liver lesion seen on the provided images. 2. Small to moderate amount of gallbladder sludge without sonographic evidence of acute cholecystitis. Electronically Signed   By: Jules Schick M.D.   On: 08/09/2023 16:19   DG Shoulder Left Result Date: 08/09/2023 CLINICAL DATA:  Larey Seat, left shoulder pain EXAM: LEFT SHOULDER - 2+ VIEW COMPARISON:  None Available. FINDINGS: Internal rotation, external rotation, transscapular views of the left shoulder are obtained. No  acute displaced fracture. There is marked decrease in the acromial humeral interval consistent with chronic longstanding rotator cuff tear. Mild glenohumeral joint osteoarthritis. Soft tissues are unremarkable. Visualized portions of the left chest are clear. IMPRESSION: 1. Narrowing of the acromial humeral interval, consistent with chronic longstanding rotator cuff tear. 2. Mild glenohumeral osteoarthritis. 3. No acute fracture. Electronically Signed   By: Sharlet Salina M.D.   On: 08/09/2023 15:38   CT Head Wo Contrast Result Date: 08/09/2023 CLINICAL DATA:  Head trauma, found down by EMS. EXAM: CT HEAD WITHOUT  CONTRAST CT CERVICAL SPINE WITHOUT CONTRAST TECHNIQUE: Multidetector CT imaging of the head and cervical spine was performed following the standard protocol without intravenous contrast. Multiplanar CT image reconstructions of the cervical spine were also generated. RADIATION DOSE REDUCTION: This exam was performed according to the departmental dose-optimization program which includes automated exposure control, adjustment of the mA and/or kV according to patient size and/or use of iterative reconstruction technique. COMPARISON:  CT head 11/07/2022, CT cervical spine 11/27/2021 FINDINGS: CT HEAD FINDINGS Brain: No acute intracranial hemorrhage. No CT evidence of acute infarct. Nonspecific hypoattenuation in the periventricular and subcortical white matter favored to reflect chronic microvascular ischemic changes. Mild generalized parenchymal volume loss. No edema, mass effect, or midline shift. The basilar cisterns are patent. Ventricles: Prominence of the ventricles suggesting underlying parenchymal volume loss. Vascular: Atherosclerotic calcifications of the carotid siphons and intracranial vertebral arteries. No hyperdense vessel. Skull: No acute or aggressive finding. Orbits: Orbits are symmetric. Sinuses: Moderate mucosal thickening in the ethmoid sinuses with additional scattered mucosal thickening  in the sphenoid sinuses and right frontal sinus. Other: Mastoid air cells are clear. CT CERVICAL SPINE FINDINGS Alignment: Cervical lordosis is maintained. No listhesis. No facet subluxation or dislocation. Skull base and vertebrae: No compression fracture or displaced fracture in the cervical spine. Soft tissues and spinal canal: No prevertebral fluid or swelling. No visible canal hematoma. Prominent atherosclerotic calcifications at the carotid bifurcations. Disc levels: Intervertebral disc space narrowing greatest at C5-6 and C6-7. Disc bulges at multiple levels. Disc osteophyte complex at C6-7 results in mild spinal canal stenosis. Upper chest: Interlobular septal thickening which could reflect pulmonary edema. Biapical pleural-parenchymal scarring. Emphysema. Other: None. IMPRESSION: No CT evidence of acute intracranial abnormality. No acute fracture or traumatic malalignment of the cervical spine. Interlobular septal thickening which could reflect pulmonary edema. Emphysema. Electronically Signed   By: Emily Filbert M.D.   On: 08/09/2023 15:20   CT Cervical Spine Wo Contrast Result Date: 08/09/2023 CLINICAL DATA:  Head trauma, found down by EMS. EXAM: CT HEAD WITHOUT CONTRAST CT CERVICAL SPINE WITHOUT CONTRAST TECHNIQUE: Multidetector CT imaging of the head and cervical spine was performed following the standard protocol without intravenous contrast. Multiplanar CT image reconstructions of the cervical spine were also generated. RADIATION DOSE REDUCTION: This exam was performed according to the departmental dose-optimization program which includes automated exposure control, adjustment of the mA and/or kV according to patient size and/or use of iterative reconstruction technique. COMPARISON:  CT head 11/07/2022, CT cervical spine 11/27/2021 FINDINGS: CT HEAD FINDINGS Brain: No acute intracranial hemorrhage. No CT evidence of acute infarct. Nonspecific hypoattenuation in the periventricular and subcortical  white matter favored to reflect chronic microvascular ischemic changes. Mild generalized parenchymal volume loss. No edema, mass effect, or midline shift. The basilar cisterns are patent. Ventricles: Prominence of the ventricles suggesting underlying parenchymal volume loss. Vascular: Atherosclerotic calcifications of the carotid siphons and intracranial vertebral arteries. No hyperdense vessel. Skull: No acute or aggressive finding. Orbits: Orbits are symmetric. Sinuses: Moderate mucosal thickening in the ethmoid sinuses with additional scattered mucosal thickening in the sphenoid sinuses and right frontal sinus. Other: Mastoid air cells are clear. CT CERVICAL SPINE FINDINGS Alignment: Cervical lordosis is maintained. No listhesis. No facet subluxation or dislocation. Skull base and vertebrae: No compression fracture or displaced fracture in the cervical spine. Soft tissues and spinal canal: No prevertebral fluid or swelling. No visible canal hematoma. Prominent atherosclerotic calcifications at the carotid bifurcations. Disc levels: Intervertebral disc space narrowing greatest at C5-6 and C6-7. Disc bulges  at multiple levels. Disc osteophyte complex at C6-7 results in mild spinal canal stenosis. Upper chest: Interlobular septal thickening which could reflect pulmonary edema. Biapical pleural-parenchymal scarring. Emphysema. Other: None. IMPRESSION: No CT evidence of acute intracranial abnormality. No acute fracture or traumatic malalignment of the cervical spine. Interlobular septal thickening which could reflect pulmonary edema. Emphysema. Electronically Signed   By: Emily Filbert M.D.   On: 08/09/2023 15:20   DG Pelvis Portable Result Date: 08/09/2023 CLINICAL DATA:  Larey Seat, left hip discomfort EXAM: PORTABLE PELVIS 1-2 VIEWS COMPARISON:  None Available. FINDINGS: Frontal view of the pelvis includes both hips. No fracture, subluxation, or dislocation. Symmetrical bilateral hip osteoarthritis. Extensive  atherosclerosis. The sacroiliac joints are normal. IMPRESSION: 1. Bilateral hip osteoarthritis.  No acute fracture. Electronically Signed   By: Sharlet Salina M.D.   On: 08/09/2023 15:01   DG Chest Portable 1 View Result Date: 08/09/2023 CLINICAL DATA:  Fall.  Altered mental status. EXAM: PORTABLE CHEST 1 VIEW COMPARISON:  04/11/2023. FINDINGS: Redemonstration of diffuse increased interstitial markings essentially similar to several prior studies. However, there is new heterogeneous opacity overlying the medial aspect of the right upper lung zone measuring approximately 4 x 4.8 cm, concerning for consolidation. Correlate clinically to determine the need for further imaging with chest CT scan versus follow-up chest radiograph to document resolution. Bilateral lung fields are otherwise clear. Bilateral costophrenic angles are clear. Stable cardio-mediastinal silhouette. No acute osseous abnormalities. The soft tissues are within normal limits. IMPRESSION: *New heterogeneous opacity overlying the medial aspect of the right upper lung zone, concerning for consolidation. Correlate clinically to determine the need for further imaging with chest CT scan versus follow-up chest radiograph to document resolution. Electronically Signed   By: Jules Schick M.D.   On: 08/09/2023 15:00     Assessment and Plan:   73 year old male with CAD coronary artery disease status post CABG with peripheral arterial disease came in with fall and a COPD exacerbation and underwent echocardiogram which revealed ejection fraction of 30 to 35% which is a reduction from prior echocardiogram in 2023 which revealed 60%.  Acute mental status changes noted, memory impairment.  Acute systolic heart failure - Ischemic cardiomyopathy, newly reduced EF of 30 to 35% from 65 2020.  This is in the setting of lactic acidosis with fall and mild rhabdomyolysis lactic acid 3 on admission improved to 1.4 with IV fluids.  CK also improved from 7 50-5  57.  His blood pressure was elevated as well and nifedipine was added.  Difficult to obtain a history.  Family does report some memory problems.  He has been on Geodon as needed.  Thought is that he likely has underlying dementia.  MRI of the brain showed no acute abnormality.  He is currently completely confused. -BNP 1300 -On carvedilol 6.25 mg twice a day -Nifedipine 60 mg a day recently ordered. -Creatinine increasing from 1.1-1.35 -BP highly elevated at 174/113 -Would hold off on ARB given rising creatinine with recent mild rhabdo, likely dehydration, lactic acidosis -If mental status improves, could consider further titration of goal-directed medical therapy such as ARB/Entresto/spironolactone.  At this time it does not appear that he is able to comply with this medication regimen.   Hyperlipidemia - Holding statin secondary to recent rhabdomyolysis  We will go ahead and sign off.  Please let us know if we can be of further assistance.    For questions or updates, please contact [facility] Please consult [email or web address] for contact info under  Signed, Donato Schultz, MD  08/12/2023 1:50 PM

## 2023-08-12 NOTE — Progress Notes (Signed)
 [facility]                                                                              Griffon Herberg, is a 73 y.o. male, DOB - 04-Oct-1950, NWG:[id number] Admit date - 08/09/2023    Outpatient Primary MD for the patient is [name], MD  LOS - 3  days  Chief Complaint  Patient presents with   Fall       Brief summary   Patient is a 73 year old male with history of COPD, CAD status post CABG, HTN, PAD, CA bladder, presented to ER with fall, syncope.  Patient is a poor historian, apparently was last seen normal a day before per ER provider who spoke with the family.  His speech was noted to be abnormal as well.  In the ER, he was found to be short of breath, bilateral diffuse rhonchi.  On presentation, serum creatinine was 1.4, lactic acidosis 3.0, CK 750.  Chest x-ray revealed new heterogenous opacity overlying the medial aspect of the right upper lung zone concerning for consolidation.  CT head C-spine revealed no CT evidence of acute intracranial abnormality, no acute fracture or traumatic malalignment of C-spine.  Interlobular septal thickening which could reflect pulmonary edema, emphysema.  Left shoulder x-ray showed chronic longstanding rotator cuff tear, mild glenohumeral osteoarthritis.  MRI brain revealed no acute intracranial abnormality.  Age-related simple atrophy with mild chronic small vessel ischemic disease reported.  Right upper quadrant ultrasound revealed subtle liver surface nodularity concerning for cirrhosis, no discrete focal liver lesion.  Small amount of gallbladder sludge  08/11/2023: Patient seen.  No significant history from patient.  Patient remains cachectic.  Decreased air entry and wheezing noted. 08/12/2023: Patient seen alongside patient's brother, son, daughter and nephew.  COPD symptoms seem to be improving.  Patient remains confused, with management problems.  Family reports concerns for possible memory difficulties.  Not certain about  prior diagnosis of dementia, probably, previously undiagnosed.  Patient will need SNF.  Assessment & Plan    Principal Problem: Acute respiratory failure with hypoxia (HCC):  -multifactorial secondary to community-acquired pneumonia, COPD with acute exacerbation, acute CHF/pulmonary edema.  Procalcitonin 0.5 -On 2 L O2 via [place], continue management of PNA, COPD, CHF -DC IV fluids, wean O2 as tolerated 08/11/2023: Continue current regimen. 08/12/2023: Improving.  Continue current regimen.  Active Problems: CAP (community acquired pneumonia) -Obtain urine Legionella antigen, urine strep antigen -Continue IV Zithromax, Rocephin 08/12/2023:  Complete course of antibiotics.    COPD with acute exacerbation (HCC) -Continue scheduled DuoNebs, Pulmicort, Brovana, Mucinex -continue IV Solu-Medrol 40 mg every 8 hours, -Continue antibiotics, flutter valve    Acute CHF (congestive heart failure) (HCC) -BNP elevated, 1312 -DC IV fluids, will placed on Lasix 40 mg IV daily, no prior history of CHF however does have history of CAD 08/12/2023: Hold IV Lasix due to rising sodium level and worsening serum creatinine.  Cannot start GDMT.  Low EF seems to be new.  Echocardiogram done in 2023 revealed EF of 60%.  History of paroxysmal atrial fibrillation and coronary artery disease noted.  Will consult the cardiology team.  Echo revealed: 1. Left  ventricular ejection fraction, by estimation, is 30 to 35%. Left  ventricular ejection fraction by 2D MOD biplane is 30.7 %. The left  ventricle has moderately decreased function. The left ventricle  demonstrates global hypokinesis with more severe   inferior hypokinesis to akinesis. There is moderate left ventricular  hypertrophy. Left ventricular diastolic parameters are indeterminate.   2. Right ventricular systolic function is moderately reduced. The right  ventricular size is mildly enlarged. There is moderately elevated  pulmonary artery systolic pressure.  The estimated right ventricular  systolic pressure is 45.0 mmHg.   3. Left atrial size was mildly dilated.   4. Right atrial size was severely dilated.   5. The mitral valve is abnormal. Mild mitral valve regurgitation.   6. The tricuspid valve is abnormal. Tricuspid valve regurgitation is  severe.   7. The aortic valve is tricuspid. Aortic valve regurgitation is not  visualized. Aortic valve sclerosis is present, with no evidence of aortic  valve stenosis.   8. The inferior vena cava is dilated in size with <50% respiratory  variability, suggesting right atrial pressure of 15 mmHg.      Lactic acidosis, fall with mild rhabdomyolysis -Patient received IV fluid bolus and continuous fluids in ED, lactic acid was 3.0 on admission, improved to 1.4 -DC IV fluids, CK improved from 750 on admission to 557 today -PT evaluation  Acute kidney injury -Presented with creatinine of 1.4, with lactic acidosis -Patient received IV fluid hydration with bolus and continuous.  -Creatinine improved, 1.1, will DC IV fluids 08/12/2023: Hold Lasix.  Rising sodium level (148) and slightly worsened serum creatinine.  Continue to monitor renal function closely.  Acute metabolic encephalopathy -Likely has underlying dementia, B12, folate normal, ammonia level normal, TSH 1.3 -No FND's, MRI of the brain showed no acute intracranial abnormality.  Age-related cerebral atrophy with mild chronic small vessel ischemic disease. -On Geodon prn 08/12/2023: Cannot rule out possible baseline dementia.  Continue to assess.  Family reports some memory problems.  Hypertensive urgency -BP elevated, awaiting pharmacy med rec 08/12/2023: Add nifedipine.  Gradually optimize blood pressure.  Hyperlipidemia -Hold statins due to rhabdomyolysis  Estimated body mass index is 21.3 kg/m as calculated from the following:   Height as of this encounter: 5\' 7"  (1.702 m).   Weight as of this encounter: 61.7 kg.  Code Status: Full  CODE STATUS DVT Prophylaxis:  enoxaparin (LOVENOX) injection 40 mg Start: 08/09/23 1815 SCDs Start: 08/09/23 1801   Level of Care: Level of care: Telemetry Medical Family Communication:  Disposition Plan:      Remains inpatient appropriate:      Procedures:    Consultants:     Antimicrobials:   Anti-infectives (From admission, onward)    Start     Dose/Rate Route Frequency Ordered Stop   08/11/23 1000  azithromycin (ZITHROMAX) tablet 250 mg        250 mg Oral Daily 08/11/23 0852 08/14/23 0959   08/10/23 1700  azithromycin (ZITHROMAX) 250 mg in dextrose 5 % 125 mL IVPB  Status:  Discontinued        250 mg 127.5 mL/hr over 60 Minutes Intravenous Every 24 hours 08/09/23 1759 08/11/23 0852   08/10/23 1000  cefTRIAXone (ROCEPHIN) 1 g in sodium chloride 0.9 % 100 mL IVPB        1 g 200 mL/hr over 30 Minutes Intravenous Every 24 hours 08/09/23 1759 08/14/23 0959   08/09/23 1515  cefTRIAXone (ROCEPHIN) 1 g in sodium chloride 0.9 % 100 mL  IVPB        1 g 200 mL/hr over 30 Minutes Intravenous  Once 08/09/23 1504 08/09/23 1622   08/09/23 1515  azithromycin (ZITHROMAX) 500 mg in sodium chloride 0.9 % 250 mL IVPB        500 mg 250 mL/hr over 60 Minutes Intravenous  Once 08/09/23 1504 08/09/23 1803          Medications  arformoterol  15 mcg Nebulization BID   azithromycin  250 mg Oral Daily   budesonide (PULMICORT) nebulizer solution  0.5 mg Nebulization BID   carvedilol  6.25 mg Oral BID WC   enoxaparin (LOVENOX) injection  40 mg Subcutaneous Q24H   furosemide  40 mg Intravenous Daily   guaiFENesin  600 mg Oral BID   ipratropium  0.5 mg Nebulization Q6H   methylPREDNISolone (SOLU-MEDROL) injection  40 mg Intravenous Q8H   sodium chloride flush  3 mL Intravenous Q12H      Subjective:  No new complaints from patient.  Objective:   Vitals:   08/12/23 0500 08/12/23 0510 08/12/23 0618 08/12/23 0744  BP:  (!) 174/113    Pulse:  90  89  Resp:  18  20  Temp:  97.8 F  (36.6 C)    TempSrc:  Oral    SpO2:  (!) 87% 91% 93%  Weight: 61.7 kg     Height:        Intake/Output Summary (Last 24 hours) at 08/12/2023 1128 Last data filed at 08/12/2023 0656 Gross per 24 hour  Intake 120 ml  Output 200 ml  Net -80 ml     Wt Readings from Last 3 Encounters:  08/12/23 61.7 kg  10/01/14 77.6 kg     Exam General: Patient is cachectic.  Patient remains confused.  Not in any distress.   Cardiovascular: S1 S2  Respiratory: Decreased air entry with wheeze. Gastrointestinal: Soft and nontender. Ext: no pedal edema bilaterally Neuro: Awake and alert.   Data Reviewed:  I have personally reviewed following labs    CBC Lab Results  Component Value Date   WBC 7.8 08/12/2023   RBC 4.56 08/12/2023   HGB 16.5 08/12/2023   HCT 50.7 08/12/2023   MCV 111.2 (H) 08/12/2023   MCH 36.2 (H) 08/12/2023   PLT 116 (L) 08/12/2023   MCHC 32.5 08/12/2023   RDW 13.2 08/12/2023     Last metabolic panel Lab Results  Component Value Date   NA 148 (H) 08/12/2023   K 4.0 08/12/2023   CL 110 08/12/2023   CO2 29 08/12/2023   BUN 57 (H) 08/12/2023   CREATININE 1.35 (H) 08/12/2023   GLUCOSE 117 (H) 08/12/2023   GFRNONAA 55 (L) 08/12/2023   CALCIUM 10.1 08/12/2023   PHOS 3.1 08/12/2023   PROT 6.9 08/11/2023   ALBUMIN 3.3 (L) 08/12/2023   BILITOT 1.6 (H) 08/11/2023   ALKPHOS 76 08/11/2023   AST 41 08/11/2023   ALT 30 08/11/2023   ANIONGAP 9 08/12/2023    CBG (last 3)  No results for input(s): "GLUCAP" in the last 72 hours.    Coagulation Profile: No results for input(s): "INR", "PROTIME" in the last 168 hours.   Radiology Studies: I have personally reviewed the imaging studies  ECHOCARDIOGRAM COMPLETE Result Date: 08/11/2023    ECHOCARDIOGRAM REPORT   Patient Name:   DAYDEN VIVERETTE Date of Exam: 08/11/2023 Medical Rec #:  [id number]        Height:       67.0 in Accession #:  [id number]       Weight:       147.0 lb Date of Birth:  1950-08-27        BSA:           1.774 m Patient Age:    73 years         BP:           168/112 mmHg Patient Gender: M                HR:           87 bpm. Exam Location:  Inpatient Procedure: 2D Echo, Cardiac Doppler and Color Doppler (Both Spectral and Color            Flow Doppler were utilized during procedure). Indications:    CHF I50.9  History:        Patient has no prior history of Echocardiogram examinations.                 CHF, COPD; Risk Factors:Hypertension.  Sonographer:    Lucendia Herrlich RCS Referring Phys: [phone] RIPUDEEP K RAI  Sonographer Comments: Image acquisition challenging due to uncooperative patient. IMPRESSIONS  1. Left ventricular ejection fraction, by estimation, is 30 to 35%. Left ventricular ejection fraction by 2D MOD biplane is 30.7 %. The left ventricle has moderately decreased function. The left ventricle demonstrates global hypokinesis with more severe  inferior hypokinesis to akinesis. There is moderate left ventricular hypertrophy. Left ventricular diastolic parameters are indeterminate.  2. Right ventricular systolic function is moderately reduced. The right ventricular size is mildly enlarged. There is moderately elevated pulmonary artery systolic pressure. The estimated right ventricular systolic pressure is 45.0 mmHg.  3. Left atrial size was mildly dilated.  4. Right atrial size was severely dilated.  5. The mitral valve is abnormal. Mild mitral valve regurgitation.  6. The tricuspid valve is abnormal. Tricuspid valve regurgitation is severe.  7. The aortic valve is tricuspid. Aortic valve regurgitation is not visualized. Aortic valve sclerosis is present, with no evidence of aortic valve stenosis.  8. The inferior vena cava is dilated in size with <50% respiratory variability, suggesting right atrial pressure of 15 mmHg. Comparison(s): No prior Echocardiogram. FINDINGS  Left Ventricle: Left ventricular ejection fraction, by estimation, is 30 to 35%. Left ventricular ejection fraction by 2D MOD  biplane is 30.7 %. The left ventricle has moderately decreased function. The left ventricle demonstrates global hypokinesis. The left ventricular internal cavity size was normal in size. There is moderate left ventricular hypertrophy. Left ventricular diastolic parameters are indeterminate. Right Ventricle: The right ventricular size is mildly enlarged. No increase in right ventricular wall thickness. Right ventricular systolic function is moderately reduced. There is moderately elevated pulmonary artery systolic pressure. The tricuspid regurgitant velocity is 2.74 m/s, and with an assumed right atrial pressure of 15 mmHg, the estimated right ventricular systolic pressure is 45.0 mmHg. Left Atrium: Left atrial size was mildly dilated. Right Atrium: Right atrial size was severely dilated. Pericardium: There is no evidence of pericardial effusion. Mitral Valve: The mitral valve is abnormal. Mild mitral valve regurgitation. Tricuspid Valve: The tricuspid valve is abnormal. Tricuspid valve regurgitation is severe. The flow in the hepatic veins is reversed during ventricular systole. Aortic Valve: The aortic valve is tricuspid. Aortic valve regurgitation is not visualized. Aortic valve sclerosis is present, with no evidence of aortic valve stenosis. Aortic valve peak gradient measures 3.8 mmHg. Pulmonic Valve: The pulmonic valve was normal in structure. Pulmonic valve regurgitation is  not visualized. No evidence of pulmonic stenosis. Aorta: The aortic root and ascending aorta are structurally normal, with no evidence of dilitation. Venous: The inferior vena cava is dilated in size with less than 50% respiratory variability, suggesting right atrial pressure of 15 mmHg. IAS/Shunts: No atrial level shunt detected by color flow Doppler.  LEFT VENTRICLE PLAX 2D                        Biplane EF (MOD) LVIDd:         4.20 cm         LV Biplane EF:   Left LVIDs:         3.90 cm                          ventricular LV PW:          1.20 cm                          ejection LV IVS:        1.30 cm                          fraction by LVOT diam:     2.30 cm                          2D MOD LV SV:         41                               biplane is LV SV Index:   23                               30.7 %. LVOT Area:     4.15 cm                                Diastology                                LV e' medial:    8.67 cm/s LV Volumes (MOD)               LV E/e' medial:  10.0 LV vol d, MOD    63.8 ml       LV e' lateral:   12.70 cm/s A2C:                           LV E/e' lateral: 6.8 LV vol d, MOD    70.4 ml A4C: LV vol s, MOD    49.3 ml A2C: LV vol s, MOD    48.8 ml A4C: LV SV MOD A2C:   14.5 ml LV SV MOD A4C:   70.4 ml LV SV MOD BP:    21.7 ml RIGHT VENTRICLE            IVC RV S prime:     6.83 cm/s  IVC diam: 2.70 cm TAPSE (M-mode): 1.4 cm LEFT ATRIUM             Index  RIGHT ATRIUM           Index LA diam:        4.70 cm 2.65 cm/m   RA Area:     29.10 cm LA Vol (A2C):   64.9 ml 36.57 ml/m  RA Volume:   100.00 ml 56.36 ml/m LA Vol (A4C):   59.0 ml 33.25 ml/m LA Biplane Vol: 63.6 ml 35.84 ml/m  AORTIC VALVE AV Area (Vmax): 3.06 cm AV Vmax:        97.10 cm/s AV Peak Grad:   3.8 mmHg LVOT Vmax:      71.43 cm/s LVOT Vmean:     46.100 cm/s LVOT VTI:       0.099 m  AORTA Ao Root diam: 3.10 cm Ao Asc diam:  3.40 cm MITRAL VALVE               TRICUSPID VALVE MV Area (PHT): 4.57 cm    TR Peak grad:   30.0 mmHg MV Decel Time: 166 msec    TR Vmax:        274.00 cm/s MR Peak grad: 16.3 mmHg MR Vmax:      202.00 cm/s  SHUNTS MV E velocity: 86.50 cm/s  Systemic VTI:  0.10 m MV A velocity: 49.80 cm/s  Systemic Diam: 2.30 cm MV E/A ratio:  1.74 Zoila Shutter MD Electronically signed by Zoila Shutter MD Signature Date/Time: 08/11/2023/12:16:14 PM    Final      Time spent: 35 minutes.  Barnetta Chapel M.D. [facility] 08/12/2023, 11:28 AM  Available via Epic secure chat 7am-7pm After 7 pm, please refer to night coverage provider  listed on amion.

## 2023-08-13 DIAGNOSIS — J9601 Acute respiratory failure with hypoxia: Secondary | ICD-10-CM | POA: Diagnosis not present

## 2023-08-13 LAB — CBC
HCT: 51.5 % (ref 39.0–52.0)
Hemoglobin: 17.5 g/dL — ABNORMAL HIGH (ref 13.0–17.0)
MCH: 36.5 pg — ABNORMAL HIGH (ref 26.0–34.0)
MCHC: 34 g/dL (ref 30.0–36.0)
MCV: 107.3 fL — ABNORMAL HIGH (ref 80.0–100.0)
Platelets: 138 10*3/uL — ABNORMAL LOW (ref 150–400)
RBC: 4.8 MIL/uL (ref 4.22–5.81)
RDW: 12.8 % (ref 11.5–15.5)
WBC: 8.2 10*3/uL (ref 4.0–10.5)
nRBC: 0 % (ref 0.0–0.2)

## 2023-08-13 LAB — COMPREHENSIVE METABOLIC PANEL
ALT: 31 U/L (ref 0–44)
AST: 32 U/L (ref 15–41)
Albumin: 3.2 g/dL — ABNORMAL LOW (ref 3.5–5.0)
Alkaline Phosphatase: 85 U/L (ref 38–126)
Anion gap: 9 (ref 5–15)
BUN: 65 mg/dL — ABNORMAL HIGH (ref 8–23)
CO2: 30 mmol/L (ref 22–32)
Calcium: 10.2 mg/dL (ref 8.9–10.3)
Chloride: 109 mmol/L (ref 98–111)
Creatinine, Ser: 1.35 mg/dL — ABNORMAL HIGH (ref 0.61–1.24)
GFR, Estimated: 55 mL/min — ABNORMAL LOW (ref >60)
Glucose, Bld: 139 mg/dL — ABNORMAL HIGH (ref 70–99)
Potassium: 4.1 mmol/L (ref 3.5–5.1)
Sodium: 148 mmol/L — ABNORMAL HIGH (ref 135–145)
Total Bilirubin: 2.1 mg/dL — ABNORMAL HIGH (ref 0.0–1.2)
Total Protein: 7.4 g/dL (ref 6.5–8.1)

## 2023-08-13 LAB — MAGNESIUM: Magnesium: 2.9 mg/dL — ABNORMAL HIGH (ref 1.7–2.4)

## 2023-08-13 MED ORDER — ENSURE ENLIVE PO LIQD
237.0000 mL | Freq: Two times a day (BID) | ORAL | Status: DC
  Administered 2023-08-13 – 2023-08-14 (×2): 237 mL via ORAL

## 2023-08-13 MED ORDER — IPRATROPIUM BROMIDE 0.02 % IN SOLN: 0.5000 mg | Freq: Two times a day (BID) | RESPIRATORY_TRACT | Status: DC

## 2023-08-13 MED ORDER — LEVALBUTEROL HCL 0.63 MG/3ML IN NEBU: 0.6300 mg | INHALATION_SOLUTION | Freq: Four times a day (QID) | RESPIRATORY_TRACT | Status: DC | PRN

## 2023-08-13 MED ORDER — ADULT MULTIVITAMIN W/MINERALS CH
1.0000 | ORAL_TABLET | Freq: Every day | ORAL | Status: DC
  Administered 2023-08-13 – 2023-08-23 (×11): 1 via ORAL
  Filled 2023-08-13 (×11): qty 1

## 2023-08-13 NOTE — Progress Notes (Signed)
 [facility]                                                                              Francisco Garcia, is a 73 y.o. male, DOB - 24-Nov-1950, ZOX:[id number] Admit date - 08/09/2023    Outpatient Primary MD for the patient is [name], MD  LOS - 4  days  Chief Complaint  Patient presents with   Fall       Brief summary   Patient is a 73 year old male with history of COPD, CAD status post CABG, HTN, PAD, CA bladder, presented to ER with fall, syncope.  Patient is a poor historian, apparently was last seen normal a day before per ER provider who spoke with the family.  His speech was noted to be abnormal as well.  In the ER, he was found to be short of breath, bilateral diffuse rhonchi.  On presentation, serum creatinine was 1.4, lactic acidosis 3.0, CK 750.  Chest x-ray revealed new heterogenous opacity overlying the medial aspect of the right upper lung zone concerning for consolidation.  CT head C-spine revealed no CT evidence of acute intracranial abnormality, no acute fracture or traumatic malalignment of C-spine.  Interlobular septal thickening which could reflect pulmonary edema, emphysema.  Left shoulder x-ray showed chronic longstanding rotator cuff tear, mild glenohumeral osteoarthritis.  MRI brain revealed no acute intracranial abnormality.  Age-related simple atrophy with mild chronic small vessel ischemic disease reported.  Right upper quadrant ultrasound revealed subtle liver surface nodularity concerning for cirrhosis, no discrete focal liver lesion.  Small amount of gallbladder sludge  08/11/2023: Patient seen.  No significant history from patient.  Patient remains cachectic.  Decreased air entry and wheezing noted. 08/12/2023: Patient seen alongside patient's brother, son, daughter and nephew.  COPD symptoms seem to be improving.  Patient remains confused, with management problems.  Family reports concerns for possible memory difficulties.  Not certain about  prior diagnosis of dementia, probably, previously undiagnosed.  Patient will need SNF 08/13/2023: Patient seen.  Patient stable.  Pursue disposition..  Assessment & Plan    Principal Problem: Acute respiratory failure with hypoxia (HCC):  -multifactorial secondary to community-acquired pneumonia, COPD with acute exacerbation, acute CHF/pulmonary edema.  Procalcitonin 0.5 -On 2 L O2 via [place], continue management of PNA, COPD, CHF -DC IV fluids, wean O2 as tolerated 08/11/2023: Continue current regimen. 08/13/2023: Improving.  Continue current regimen.  Active Problems: CAP (community acquired pneumonia) -Obtain urine Legionella antigen, urine strep antigen -Continue IV Zithromax, Rocephin 08/13/2023: Patient has completed course of antibiotics today.  COPD with acute exacerbation (HCC) -Continue scheduled DuoNebs, Pulmicort, Brovana, Mucinex -continue IV Solu-Medrol 40 mg every 8 hours, -Continue antibiotics, flutter valve    Acute CHF (congestive heart failure) (HCC) -BNP elevated, 1312 -DC IV fluids, will placed on Lasix 40 mg IV daily, no prior history of CHF however does have history of CAD 08/12/2023: Hold IV Lasix due to rising sodium level and worsening serum creatinine.  Cannot start GDMT.  Low EF seems to be new.  Echocardiogram done in 2023 revealed EF of 60%.  History of paroxysmal atrial fibrillation and coronary artery disease noted.  Will  consult the cardiology team.  Echo revealed: 1. Left ventricular ejection fraction, by estimation, is 30 to 35%. Left  ventricular ejection fraction by 2D MOD biplane is 30.7 %. The left  ventricle has moderately decreased function. The left ventricle  demonstrates global hypokinesis with more severe   inferior hypokinesis to akinesis. There is moderate left ventricular  hypertrophy. Left ventricular diastolic parameters are indeterminate.   2. Right ventricular systolic function is moderately reduced. The right  ventricular size is  mildly enlarged. There is moderately elevated  pulmonary artery systolic pressure. The estimated right ventricular  systolic pressure is 45.0 mmHg.   3. Left atrial size was mildly dilated.   4. Right atrial size was severely dilated.   5. The mitral valve is abnormal. Mild mitral valve regurgitation.   6. The tricuspid valve is abnormal. Tricuspid valve regurgitation is  severe.   7. The aortic valve is tricuspid. Aortic valve regurgitation is not  visualized. Aortic valve sclerosis is present, with no evidence of aortic  valve stenosis.   8. The inferior vena cava is dilated in size with <50% respiratory  variability, suggesting right atrial pressure of 15 mmHg.      Lactic acidosis, fall with mild rhabdomyolysis -Patient received IV fluid bolus and continuous fluids in ED, lactic acid was 3.0 on admission, improved to 1.4 -DC IV fluids, CK improved from 750 on admission to 557 today -PT evaluation  Acute kidney injury -Presented with creatinine of 1.4, with lactic acidosis -Patient received IV fluid hydration with bolus and continuous.  -Creatinine improved, 1.1, will DC IV fluids 08/12/2023: Hold Lasix.  Rising sodium level (148) and slightly worsened serum creatinine.  Continue to monitor renal function closely.  Acute metabolic encephalopathy -Likely has underlying dementia, B12, folate normal, ammonia level normal, TSH 1.3 -No FND's, MRI of the brain showed no acute intracranial abnormality.  Age-related cerebral atrophy with mild chronic small vessel ischemic disease. -On Geodon prn 08/12/2023: Cannot rule out possible baseline dementia.  Continue to assess.  Family reports some memory problems.  Hypertensive urgency -BP elevated, awaiting pharmacy med rec 08/12/2023: Add nifedipine.  Gradually optimize blood pressure.  Hyperlipidemia -Hold statins due to rhabdomyolysis  Estimated body mass index is 20.86 kg/m as calculated from the following:   Height as of this  encounter: 5\' 7"  (1.702 m).   Weight as of this encounter: 60.4 kg.  Code Status: Full CODE STATUS DVT Prophylaxis:  enoxaparin (LOVENOX) injection 40 mg Start: 08/09/23 1815 SCDs Start: 08/09/23 1801   Level of Care: Level of care: Telemetry Medical Family Communication:  Disposition Plan:      Remains inpatient appropriate:      Procedures:    Consultants:     Antimicrobials:   Anti-infectives (From admission, onward)    Start     Dose/Rate Route Frequency Ordered Stop   08/11/23 1000  azithromycin (ZITHROMAX) tablet 250 mg        250 mg Oral Daily 08/11/23 0852 08/13/23 1006   08/10/23 1700  azithromycin (ZITHROMAX) 250 mg in dextrose 5 % 125 mL IVPB  Status:  Discontinued        250 mg 127.5 mL/hr over 60 Minutes Intravenous Every 24 hours 08/09/23 1759 08/11/23 0852   08/10/23 1000  cefTRIAXone (ROCEPHIN) 1 g in sodium chloride 0.9 % 100 mL IVPB        1 g 200 mL/hr over 30 Minutes Intravenous Every 24 hours 08/09/23 1759 08/13/23 1043   08/09/23 1515  cefTRIAXone (ROCEPHIN)  1 g in sodium chloride 0.9 % 100 mL IVPB        1 g 200 mL/hr over 30 Minutes Intravenous  Once 08/09/23 1504 08/09/23 1622   08/09/23 1515  azithromycin (ZITHROMAX) 500 mg in sodium chloride 0.9 % 250 mL IVPB        500 mg 250 mL/hr over 60 Minutes Intravenous  Once 08/09/23 1504 08/09/23 1803          Medications  arformoterol  15 mcg Nebulization BID   budesonide (PULMICORT) nebulizer solution  0.5 mg Nebulization BID   carvedilol  6.25 mg Oral BID WC   enoxaparin (LOVENOX) injection  40 mg Subcutaneous Q24H   feeding supplement  237 mL Oral BID BM   guaiFENesin  600 mg Oral BID   ipratropium  0.5 mg Nebulization Q6H   methylPREDNISolone (SOLU-MEDROL) injection  40 mg Intravenous Q8H   multivitamin with minerals  1 tablet Oral Daily   NIFEdipine  60 mg Oral Daily   sodium chloride flush  3 mL Intravenous Q12H      Subjective:  No new complaints from patient.  Objective:    Vitals:   08/13/23 0741 08/13/23 0822 08/13/23 1452 08/13/23 1559  BP:  (!) 150/86  (!) 149/106  Pulse: 94 93 91 81  Resp: 20 18 (!) 21 17  Temp:  (!) 97 F (36.1 C)    TempSrc:  Oral    SpO2: 92% 95% 93% (!) 86%  Weight:      Height:        Intake/Output Summary (Last 24 hours) at 08/13/2023 1623 Last data filed at 08/13/2023 0865 Gross per 24 hour  Intake 600 ml  Output 1500 ml  Net -900 ml     Wt Readings from Last 3 Encounters:  08/13/23 60.4 kg  10/01/14 77.6 kg     Exam General: Patient is cachectic.  Patient remains confused.  Not in any distress.   Cardiovascular: S1 S2  Respiratory: Decreased air entry with wheeze. Gastrointestinal: Soft and nontender. Ext: no pedal edema bilaterally Neuro: Awake and alert.   Data Reviewed:  I have personally reviewed following labs    CBC Lab Results  Component Value Date   WBC 8.2 08/13/2023   RBC 4.80 08/13/2023   HGB 17.5 (H) 08/13/2023   HCT 51.5 08/13/2023   MCV 107.3 (H) 08/13/2023   MCH 36.5 (H) 08/13/2023   PLT 138 (L) 08/13/2023   MCHC 34.0 08/13/2023   RDW 12.8 08/13/2023     Last metabolic panel Lab Results  Component Value Date   NA 148 (H) 08/13/2023   K 4.1 08/13/2023   CL 109 08/13/2023   CO2 30 08/13/2023   BUN 65 (H) 08/13/2023   CREATININE 1.35 (H) 08/13/2023   GLUCOSE 139 (H) 08/13/2023   GFRNONAA 55 (L) 08/13/2023   CALCIUM 10.2 08/13/2023   PHOS 3.1 08/12/2023   PROT 7.4 08/13/2023   ALBUMIN 3.2 (L) 08/13/2023   BILITOT 2.1 (H) 08/13/2023   ALKPHOS 85 08/13/2023   AST 32 08/13/2023   ALT 31 08/13/2023   ANIONGAP 9 08/13/2023    CBG (last 3)  No results for input(s): "GLUCAP" in the last 72 hours.    Coagulation Profile: No results for input(s): "INR", "PROTIME" in the last 168 hours.   Radiology Studies: I have personally reviewed the imaging studies  No results found.    Time spent: 35 minutes.  Barnetta Chapel M.D. [facility] 08/13/2023, 4:23  PM  Available via Epic secure chat 7am-7pm After 7 pm, please refer to night coverage provider listed on amion.

## 2023-08-13 NOTE — Progress Notes (Signed)
 Initial Nutrition Assessment  DOCUMENTATION CODES:   Not applicable  INTERVENTION:  Multivitamin w/ minerals daily Encourage good PO intake Ensure Enlive po BID, each supplement provides 350 kcal and 20 grams of protein 48 hr Calorie Count   NUTRITION DIAGNOSIS:   Increased nutrient needs related to chronic illness (COPD, CHF) as evidenced by estimated needs.  GOAL:   Patient will meet greater than or equal to 90% of their needs  MONITOR:   PO intake, Supplement acceptance, Labs, I & O's, Weight trends  REASON FOR ASSESSMENT:   Consult Assessment of nutrition requirement/status  ASSESSMENT:   73 y.o. male presented to the ED with fall/syncope. PMH includes COPD, CAD s/p CABG, HTN, PAD, bladder cancer, and CHF. Pt admitted with acute respiratory failure 2/2 PNA, altered mental status, and AKI.   3/19 - Admitted  RD working remotely at time of assessment. Per RN assessment, pt oriented to self and has poor attention. MD placed consult for a calorie count and assessment of nutrition requirement. Discussed with MD, MD would like for calorie count to take place.  Discussed starting calorie count with RN. RN to hang up envelope on door.   No meal intakes have ben recorded this admission.RN reports that pt refused breakfast, but ate ~60% of lunch.   No weight history over the past year to assess pt for weight loss.   Medications reviewed and include: Solu-medrol Labs reviewed: Sodium 148, Potassium 4.1, BUN 65, Creatinine 1.35, Magnesium 2.9  NUTRITION - FOCUSED PHYSICAL EXAM:  Deferred to follow-up.   Diet Order:   Diet Order             Diet 2 gram sodium Room service appropriate? Yes; Fluid consistency: Thin  Diet effective now                   EDUCATION NEEDS:   Not appropriate for education at this time  Skin:  Skin Assessment: Reviewed RN Assessment  Last BM:  3/23  Height:  Ht Readings from Last 1 Encounters:  08/09/23 5\' 7"  (1.702 m)    Weight:  Wt Readings from Last 1 Encounters:  08/13/23 60.4 kg   Ideal Body Weight:  67.3 kg  BMI:  Body mass index is 20.86 kg/m.  Estimated Nutritional Needs:  Kcal:  1800-2000 Protein:  90-110 grams Fluid:  >/= 1.8 L   Kirby Crigler RD, LDN Clinical Dietitian

## 2023-08-14 DIAGNOSIS — E43 Unspecified severe protein-calorie malnutrition: Secondary | ICD-10-CM | POA: Insufficient documentation

## 2023-08-14 DIAGNOSIS — J9601 Acute respiratory failure with hypoxia: Secondary | ICD-10-CM | POA: Diagnosis not present

## 2023-08-14 LAB — CBC
HCT: 57.3 % — ABNORMAL HIGH (ref 39.0–52.0)
Hemoglobin: 18.8 g/dL — ABNORMAL HIGH (ref 13.0–17.0)
MCH: 36.3 pg — ABNORMAL HIGH (ref 26.0–34.0)
MCHC: 32.8 g/dL (ref 30.0–36.0)
MCV: 110.6 fL — ABNORMAL HIGH (ref 80.0–100.0)
Platelets: 150 10*3/uL (ref 150–400)
RBC: 5.18 MIL/uL (ref 4.22–5.81)
RDW: 12.7 % (ref 11.5–15.5)
WBC: 9.8 10*3/uL (ref 4.0–10.5)
nRBC: 0 % (ref 0.0–0.2)

## 2023-08-14 LAB — COMPREHENSIVE METABOLIC PANEL
ALT: 31 U/L (ref 0–44)
AST: 24 U/L (ref 15–41)
Albumin: 3.1 g/dL — ABNORMAL LOW (ref 3.5–5.0)
Alkaline Phosphatase: 76 U/L (ref 38–126)
Anion gap: 12 (ref 5–15)
BUN: 53 mg/dL — ABNORMAL HIGH (ref 8–23)
CO2: 29 mmol/L (ref 22–32)
Calcium: 10.8 mg/dL — ABNORMAL HIGH (ref 8.9–10.3)
Chloride: 113 mmol/L — ABNORMAL HIGH (ref 98–111)
Creatinine, Ser: 1.25 mg/dL — ABNORMAL HIGH (ref 0.61–1.24)
GFR, Estimated: >60 mL/min (ref >60)
Glucose, Bld: 138 mg/dL — ABNORMAL HIGH (ref 70–99)
Potassium: 4.6 mmol/L (ref 3.5–5.1)
Sodium: 154 mmol/L — ABNORMAL HIGH (ref 135–145)
Total Bilirubin: 1.9 mg/dL — ABNORMAL HIGH (ref 0.0–1.2)
Total Protein: 7.3 g/dL (ref 6.5–8.1)

## 2023-08-14 LAB — MAGNESIUM: Magnesium: 2.9 mg/dL — ABNORMAL HIGH (ref 1.7–2.4)

## 2023-08-14 MED ORDER — ENSURE ENLIVE PO LIQD
237.0000 mL | Freq: Three times a day (TID) | ORAL | Status: DC
  Administered 2023-08-14 – 2023-08-23 (×13): 237 mL via ORAL

## 2023-08-14 MED ORDER — DEXTROSE 5 % IV SOLN: INTRAVENOUS | Status: AC

## 2023-08-14 NOTE — TOC Progression Note (Addendum)
 Transition of Care [facility]) - Progression Note    Patient Details  Name: Francisco Garcia MRN: [id number] Date of Birth: Aug 28, 1950  [facility] CM/SW Contact  Erin Sons, [place] Phone Number: 08/14/2023, 3:42 PM  Clinical Narrative:     Pt oriented x1. CSW called pt's daughter to discuss SNF. No answer; left voicemail requesting return call.   1605: Daughter called CSW back. CSW explained PT/OT rec for SNF. Daughter is agreeable to SNF w/[place] for [place]. CSW explained medicare coverage and auth process. Fl2 completed and bed requests sent in hub.   Daughter provides alternative phone number, [phone] as her number may disconnect in the next few days.          Social Determinants of Health (SDOH) Interventions SDOH Screenings   Food Insecurity: Patient Unable To Answer (08/09/2023)  Housing: Patient Unable To Answer (08/09/2023)  Transportation Needs: Patient Unable To Answer (08/09/2023)  Utilities: Patient Unable To Answer (08/09/2023)  Tobacco Use: Medium Risk (08/09/2023)    Readmission Risk Interventions     No data to display

## 2023-08-14 NOTE — NC FL2 (Signed)
 [place] MEDICAID FL2 LEVEL OF CARE FORM     IDENTIFICATION  Patient Name: Francisco Garcia Birthdate: 11-30-1950 Sex: male Admission Date (Current Location): 08/09/2023  [facility] and [place] Number:  Producer, television/film/video and Address:  [facility], [address], [place], [place] [address]      Provider Number: [id number]  Attending Physician Name and Address:  Barnetta Chapel, MD  Relative Name and Phone Number:  Eliseo Gum (Daughter)  [phone] [facility])    Current Level of Care: Hospital Recommended Level of Care: Skilled Nursing Facility Prior Approval Number:    Date Approved/Denied:   PASRR Number: [id number]  Discharge Plan: SNF    Current Diagnoses: Patient Active Problem List   Diagnosis Date Noted   Acute metabolic encephalopathy 08/10/2023   Acute respiratory failure with hypoxia (HCC) 08/10/2023   COPD with acute exacerbation (HCC) 08/10/2023   Lactic acidosis 08/10/2023   Acute CHF (congestive heart failure) (HCC) 08/10/2023   Hypertensive urgency 08/10/2023   AMS (altered mental status) 08/09/2023   CAP (community acquired pneumonia) 08/09/2023   Fall 08/09/2023   Onychomycosis 10/01/2014   Pain in lower limb 10/01/2014    Orientation RESPIRATION BLADDER Height & Weight     Self  O2 (2L nasal cannula) Continent Weight: 126 lb 12.2 oz (57.5 kg) Height:  5\' 7"  (170.2 cm)  BEHAVIORAL SYMPTOMS/MOOD NEUROLOGICAL BOWEL NUTRITION STATUS      Continent Diet (see d/c summary)  AMBULATORY STATUS COMMUNICATION OF NEEDS Skin   Extensive Assist Verbally Normal                       Personal Care Assistance Level of Assistance  Bathing, Feeding, Dressing Bathing Assistance: Maximum assistance Feeding assistance: Independent Dressing Assistance: Maximum assistance     Functional Limitations Info  Sight, Hearing, Speech Sight Info: Adequate Hearing Info: Impaired      SPECIAL CARE FACTORS FREQUENCY  OT (By  licensed OT), PT (By licensed PT)     PT Frequency: 5x/week OT Frequency: 5x/week            Contractures Contractures Info: Not present    Additional Factors Info  Code Status, Allergies Code Status Info: full code Allergies Info: aspirin, Penicillins           Current Medications (08/14/2023):  This is the current hospital active medication list Current Facility-Administered Medications  Medication Dose Route Frequency Provider Last Rate Last Admin   acetaminophen (TYLENOL) tablet 650 mg  650 mg Oral Q6H PRN Amin, Ankit C, MD       Or   acetaminophen (TYLENOL) suppository 650 mg  650 mg Rectal Q6H PRN Amin, Ankit C, MD       arformoterol (BROVANA) nebulizer solution 15 mcg  15 mcg Nebulization BID Rai, Ripudeep K, MD   15 mcg at 08/14/23 0823   bisacodyl (DULCOLAX) EC tablet 5 mg  5 mg Oral Daily PRN Amin, Ankit C, MD       budesonide (PULMICORT) nebulizer solution 0.5 mg  0.5 mg Nebulization BID Amin, Ankit C, MD   0.5 mg at 08/14/23 0826   carvedilol (COREG) tablet 6.25 mg  6.25 mg Oral BID WC Rai, Ripudeep K, MD   6.25 mg at 08/14/23 1533   enoxaparin (LOVENOX) injection 40 mg  40 mg Subcutaneous Q24H Amin, Ankit C, MD   40 mg at 08/13/23 1802   feeding supplement (ENSURE ENLIVE / ENSURE PLUS) liquid 237 mL  237 mL  Oral TID BM Oretha Milch, MD   237 mL at 08/14/23 1524   glucagon (human recombinant) (GLUCAGEN) injection 1 mg  1 mg Intravenous PRN Amin, Ankit C, MD       guaiFENesin (MUCINEX) 12 hr tablet 600 mg  600 mg Oral BID Amin, Ankit C, MD   600 mg at 08/14/23 0930   guaiFENesin (ROBITUSSIN) 100 MG/5ML liquid 5 mL  5 mL Oral Q4H PRN Amin, Ankit C, MD       hydrALAZINE (APRESOLINE) injection 10 mg  10 mg Intravenous Q4H PRN Amin, Ankit C, MD       HYDROmorphone (DILAUDID) injection 0.5-1 mg  0.5-1 mg Intravenous Q2H PRN Amin, Ankit C, MD   1 mg at 08/11/23 0941   levalbuterol (XOPENEX) nebulizer solution 0.63 mg  0.63 mg Nebulization Q6H PRN Berton Mount I, MD        methylPREDNISolone sodium succinate (SOLU-MEDROL) 40 mg/mL injection 40 mg  40 mg Intravenous Q8H Amin, Ankit C, MD   40 mg at 08/14/23 1524   metoprolol tartrate (LOPRESSOR) injection 5 mg  5 mg Intravenous Q4H PRN Amin, Ankit C, MD       multivitamin with minerals tablet 1 tablet  1 tablet Oral Daily Berton Mount I, MD   1 tablet at 08/14/23 0930   nicotine (NICODERM CQ - dosed in mg/24 hours) patch 21 mg  21 mg Transdermal Daily PRN Amin, Ankit C, MD       NIFEdipine (PROCARDIA XL/NIFEDICAL XL) 24 hr tablet 60 mg  60 mg Oral Daily Berton Mount I, MD   60 mg at 08/14/23 0930   ondansetron (ZOFRAN) tablet 4 mg  4 mg Oral Q6H PRN Amin, Ankit C, MD       Or   ondansetron (ZOFRAN) injection 4 mg  4 mg Intravenous Q6H PRN Amin, Ankit C, MD       oxyCODONE (Oxy IR/ROXICODONE) immediate release tablet 5 mg  5 mg Oral Q4H PRN Amin, Ankit C, MD   5 mg at 08/12/23 2201   senna-docusate (Senokot-S) tablet 1 tablet  1 tablet Oral QHS PRN Amin, Ankit C, MD       sodium chloride flush (NS) 0.9 % injection 3 mL  3 mL Intravenous Q12H Rai, Ripudeep K, MD   3 mL at 08/14/23 0930   sodium chloride flush (NS) 0.9 % injection 3 mL  3 mL Intravenous PRN Rai, Ripudeep K, MD       sodium phosphate (FLEET) enema 1 enema  1 enema Rectal Once PRN Amin, Ankit C, MD       ziprasidone (GEODON) injection 20 mg  20 mg Intramuscular Once PRN Howerter, Justin B, DO         Discharge Medications: Please see discharge summary for a list of discharge medications.  Relevant Imaging Results:  Relevant Lab Results:   Additional Information SSN [id number] [place], [place]

## 2023-08-14 NOTE — Progress Notes (Signed)
 Physical Therapy Treatment Patient Details Name: Francisco Garcia MRN: [id number] DOB: 1951-05-11 Today's Date: 08/14/2023   History of Present Illness 73 year old male presented to ER 3/19 with fall, syncope, Acute respiratory failure with hypoxia, CAP (community acquired pneumonia), COPD with acute exacerbation (HCC), Acute CHF (congestive heart failure), Lactic acidosis, fall with mild rhabdomyolysis. PMHx: COPD, CAD status post CABG, HTN, PAD, CA bladder.    PT Comments  Pt with very poor cognition and not following commands or communicating. Sat EOB but unable to progress past there with pt not able to process trying to stand. Progress may improve if cognition improves. Unsure of baseline cognition.     If plan is discharge home, recommend the following: Two people to help with walking and/or transfers;A lot of help with bathing/dressing/bathroom;Assistance with cooking/housework;Direct supervision/assist for medications management;Direct supervision/assist for financial management;Assist for transportation;Supervision due to cognitive status   Can travel by private vehicle     No  Equipment Recommendations  None recommended by PT    Recommendations for Other Services       Precautions / Restrictions Precautions Precautions: Fall Recall of Precautions/Restrictions: Impaired Precaution/Restrictions Comments: monitor O2. Restrictions Weight Bearing Restrictions Per Provider Order: No     Mobility  Bed Mobility Overal bed mobility: Needs Assistance Bed Mobility: Supine to Sit, Sit to Supine     Supine to sit: Max assist, HOB elevated Sit to supine: Min assist   General bed mobility comments: Assist to bring legs off of bed, elevate trunk into sitting and bring hips to EOB. Assist to bring legs back up into bed.    Transfers                   General transfer comment: Attempted x 2 to have pt try to stand with Stedy. Pt unable to process what we were trying to  do.    Ambulation/Gait                   Stairs             Wheelchair Mobility     Tilt Bed    Modified Rankin (Stroke Patients Only)       Balance Overall balance assessment: Needs assistance Sitting-balance support: Feet supported, Bilateral upper extremity supported Sitting balance-Leahy Scale: Poor Sitting balance - Comments: min to CGA                                    Communication Communication Communication: Impaired Factors Affecting Communication: Difficulty expressing self;Reduced clarity of speech  Cognition Arousal: Alert Behavior During Therapy: Flat affect   PT - Cognitive impairments: No family/caregiver present to determine baseline, Difficult to assess Difficult to assess due to: Impaired communication                     PT - Cognition Comments: Pt with very little attempts to speak. His few times of trying to speak were unintelligible. Following commands: Impaired Following commands impaired:  (Not following commands)    Cueing Cueing Techniques: Verbal cues, Gestural cues, Tactile cues  Exercises Other Exercises Other Exercises: Bridging with verbal/tactile cues x 5    General Comments        Pertinent Vitals/Pain Pain Assessment Pain Assessment: PAINAD Breathing: normal Negative Vocalization: none Facial Expression: smiling or inexpressive Body Language: tense, distressed pacing, fidgeting Consolability: distracted or reassured by voice/touch PAINAD Score: 2 Pain Intervention(s):  Limited activity within patient's tolerance, Monitored during session, Repositioned    Home Living                          Prior Function            PT Goals (current goals can now be found in the care plan section) Acute Rehab PT Goals Patient Stated Goal: none stated Progress towards PT goals: Not progressing toward goals - comment    Frequency    Min 2X/week      PT Plan       Co-evaluation              AM-PAC PT "6 Clicks" Mobility   Outcome Measure  Help needed turning from your back to your side while in a flat bed without using bedrails?: A Lot Help needed moving from lying on your back to sitting on the side of a flat bed without using bedrails?: A Lot Help needed moving to and from a bed to a chair (including a wheelchair)?: Total Help needed standing up from a chair using your arms (e.g., wheelchair or bedside chair)?: Total Help needed to walk in hospital room?: Total Help needed climbing 3-5 steps with a railing? : Total 6 Click Score: 8    End of Session Equipment Utilized During Treatment: Oxygen Activity Tolerance: Other (comment) (confusion) Patient left: in bed;with call bell/phone within reach;with bed alarm set (.)   PT Visit Diagnosis: Repeated falls (R29.6);Muscle weakness (generalized) (M62.81);History of falling (Z91.81);Difficulty in walking, not elsewhere classified (R26.2);Other symptoms and signs involving the nervous system (R29.898)     Time: 1610-9604 PT Time Calculation (min) (ACUTE ONLY): 24 min  Charges:    $Therapeutic Activity: 23-37 mins PT General Charges $$ ACUTE PT VISIT: 1 Visit                     [facility] Acute Rehabilitation Services Office [phone]    Angelina Ok [facility] 08/14/2023, 1:56 PM

## 2023-08-14 NOTE — Progress Notes (Signed)
 Nutrition Follow-up  DOCUMENTATION CODES:   Severe malnutrition in context of chronic illness  INTERVENTION:  Change diet order from regular texture to pureed (DYS 1). Change frequency of Ensure Enlive to TID. Each supplement provides 350 kcal and 20 grams of protein. Order trail of Magic Cup BID.  Continue MVI with minerals.  NUTRITION DIAGNOSIS:   Severe Malnutrition related to chronic illness as evidenced by severe fat depletion, severe muscle depletion.  GOAL:   Patient will meet greater than or equal to 90% of their needs  MONITOR:   PO intake, Supplement acceptance, Labs, I & O's, Weight trends  REASON FOR ASSESSMENT:   Consult Assessment of nutrition requirement/status  ASSESSMENT:   73 y.o. male presented to the ED with fall/syncope. PMH includes COPD, CAD s/p CABG, HTN, PAD, bladder cancer, and CHF. Pt admitted with acute respiratory failure 2/2 PNA, altered mental status, and AKI.  3/22- COPD symptoms improving. Pt remains confused. Possible prior diagnosis of dementia. Lasix held due to rising sodium level and worsening serum creatinine.   No family present in room during time of RD interview. RD discussed with RN who reported pt is doing better with liquids. Pt is on regular texture diet at this time but would benefit from pureed. RN is feeding Ensure through spoon. Pt drank 200 mL of Ensure and 120 mL of 2% milk for breakfast this morning. Only data on Calorie count on 3/23 was 200 mL of Ensure. Will continue to follow for calorie count information.   Medications reviewed and include: MVI with minerals.    Labs reviewed: Sodium 154 H, Magnesium 2.9 H, BG ranging from 117-138, BUN 53 H   NUTRITION - FOCUSED PHYSICAL EXAM:  Flowsheet Row Most Recent Value  Orbital Region Severe depletion  Upper Arm Region Severe depletion  Thoracic and Lumbar Region Severe depletion  Buccal Region Severe depletion  Temple Region Moderate depletion  Clavicle Bone Region  Severe depletion  Clavicle and Acromion Bone Region Severe depletion  Scapular Bone Region Severe depletion  Dorsal Hand Unable to assess  Patellar Region Severe depletion  Anterior Thigh Region Severe depletion  Posterior Calf Region Severe depletion  Edema (RD Assessment) None  Hair Reviewed  Eyes Reviewed  Mouth Reviewed  Skin Reviewed  Nails Reviewed       Diet Order:   Diet Order             Diet 2 gram sodium Room service appropriate? Yes; Fluid consistency: Thin  Diet effective now                   EDUCATION NEEDS:   Education needs have been addressed  Skin:  Skin Assessment: Reviewed RN Assessment  Last BM:  3/23  Height:   Ht Readings from Last 1 Encounters:  08/09/23 5\' 7"  (1.702 m)    Weight:   Wt Readings from Last 1 Encounters:  08/14/23 57.5 kg   Weights reviewed, weight is trending down.   Ideal Body Weight:  67.3 kg  BMI:  Body mass index is 19.85 kg/m.  Estimated Nutritional Needs:   Kcal:  1800-2000  Protein:  90-110 grams  Fluid:  >/= 1.8 L  Francisco Garcia, MPH, RD, LDN Clinical Dietitian Contact information can be found at [facility].

## 2023-08-14 NOTE — Plan of Care (Signed)

## 2023-08-14 NOTE — Progress Notes (Signed)
 [facility]                                                                              Francisco Garcia, is a 73 y.o. male, DOB - 01/17/1951, EAV:[id number] Admit date - 08/09/2023    Outpatient Primary MD for the patient is Francisco Damme, MD  LOS - 5  days  Chief Complaint  Patient presents with   Fall       Brief summary   Patient is a 73 year old male with history of COPD, CAD status post CABG, HTN, PAD, and CA bladder.  Patient presented to ER with fall/      syncope.  Patient is a poor historian, apparently was last seen normal a day before per ER provider who spoke with the family.  His speech was noted to be abnormal as well.  In the ER, he was found to be short of breath, bilateral diffuse rhonchi.  On presentation, serum creatinine was 1.4, lactic acidosis 3.0, CK 750.  Chest x-ray revealed new heterogenous opacity overlying the medial aspect of the right upper lung zone concerning for consolidation.  CT head C-spine revealed no CT evidence of acute intracranial abnormality, no acute fracture or traumatic malalignment of C-spine.  Interlobular septal thickening which could reflect pulmonary edema, emphysema.  Left shoulder x-ray showed chronic longstanding rotator cuff tear, mild glenohumeral osteoarthritis.  MRI brain revealed no acute intracranial abnormality.  Age-related simple atrophy with mild chronic small vessel ischemic disease reported.  Right upper quadrant ultrasound revealed subtle liver surface nodularity concerning for cirrhosis, no discrete focal liver lesion.  Small amount of gallbladder sludge  08/14/2023: Patient seen.  Patient stable.  Pursue disposition..  Assessment & Plan    Principal Problem: Acute respiratory failure with hypoxia (HCC):  -multifactorial secondary to community-acquired pneumonia, COPD with acute exacerbation, acute CHF/pulmonary edema.  Procalcitonin 0.5 -On 2 L O2 via [place], continue management of PNA, COPD, CHF 08/14/2023:  Echocardiogram revealed EF of 30 to 35%.  Optimize volume.  Cardiology input is appreciated.  Stable for discharge..  Active Problems: CAP (community acquired pneumonia) 08/13/2023: Patient has completed course of antibiotics today.  COPD with acute exacerbation (HCC) -Continue scheduled DuoNebs, Pulmicort, Brovana, Mucinex -continue IV Solu-Medrol 40 mg every 8 hours.  Consider changing to oral when feasible. -Stable.   Acute CHF (congestive heart failure) (HCC) -BNP elevated, 1312. -Cannot start GDMT.  Low EF seems to be new.  Echocardiogram done in 2023 revealed EF of 60%.  History of paroxysmal atrial fibrillation and coronary artery disease noted.   -Cardiology input is appreciated.    Echo revealed: 1. Left ventricular ejection fraction, by estimation, is 30 to 35%. Left  ventricular ejection fraction by 2D MOD biplane is 30.7 %. The left  ventricle has moderately decreased function. The left ventricle  demonstrates global hypokinesis with more severe   inferior hypokinesis to akinesis. There is moderate left ventricular  hypertrophy. Left ventricular diastolic parameters are indeterminate.   2. Right ventricular systolic function is moderately reduced. The right  ventricular size is mildly enlarged. There is moderately elevated  pulmonary artery systolic pressure. The estimated right ventricular  systolic pressure is 45.0 mmHg.   3. Left atrial size was mildly dilated.   4. Right atrial size was severely dilated.   5. The mitral valve is abnormal. Mild mitral valve regurgitation.   6. The tricuspid valve is abnormal. Tricuspid valve regurgitation is  severe.   7. The aortic valve is tricuspid. Aortic valve regurgitation is not  visualized. Aortic valve sclerosis is present, with no evidence of aortic  valve stenosis.   8. The inferior vena cava is dilated in size with <50% respiratory  variability, suggesting right atrial pressure of 15 mmHg.   Lactic acidosis, fall with  mild rhabdomyolysis -Patient received IV fluid bolus and continuous fluids in ED, lactic acid was 3.0 on admission, improved to 1.4   Acute kidney injury -Presented with creatinine of 1.4, with lactic acidosis -Patient received IV fluid hydration with bolus and continuous.  -Serum creatinine of 1.25 today.  Acute metabolic encephalopathy -Likely has underlying dementia, B12, folate normal, ammonia level normal, TSH 1.3 -No FND's, MRI of the brain showed no acute intracranial abnormality.  Age-related cerebral atrophy with mild chronic small vessel ischemic disease. -On Geodon prn -Sodium of 154 today. -Free water if possible.  Hypertensive urgency -Resolved. -Blood pressure is back to normal.    Hyperlipidemia -Hold statins due to rhabdomyolysis  Hypernatremia: -Free water deficit of 2.88 L. -However, patient has EF of 30 to 35%. -Gentle hydration (D5 water at 50 cc/h x 20 hours only and stop). -Renal panel in AM.  Estimated body mass index is 19.85 kg/m as calculated from the following:   Height as of this encounter: 5\' 7"  (1.702 m).   Weight as of this encounter: 57.5 kg.  Code Status: Full CODE STATUS DVT Prophylaxis:  enoxaparin (LOVENOX) injection 40 mg Start: 08/09/23 1815 SCDs Start: 08/09/23 1801   Level of Care: Level of care: Telemetry Medical Family Communication:  Disposition Plan:      Remains inpatient appropriate:      Procedures:    Consultants:     Antimicrobials:   Anti-infectives (From admission, onward)    Start     Dose/Rate Route Frequency Ordered Stop   08/11/23 1000  azithromycin (ZITHROMAX) tablet 250 mg        250 mg Oral Daily 08/11/23 0852 08/13/23 1006   08/10/23 1700  azithromycin (ZITHROMAX) 250 mg in dextrose 5 % 125 mL IVPB  Status:  Discontinued        250 mg 127.5 mL/hr over 60 Minutes Intravenous Every 24 hours 08/09/23 1759 08/11/23 0852   08/10/23 1000  cefTRIAXone (ROCEPHIN) 1 g in sodium chloride 0.9 % 100 mL IVPB         1 g 200 mL/hr over 30 Minutes Intravenous Every 24 hours 08/09/23 1759 08/13/23 1043   08/09/23 1515  cefTRIAXone (ROCEPHIN) 1 g in sodium chloride 0.9 % 100 mL IVPB        1 g 200 mL/hr over 30 Minutes Intravenous  Once 08/09/23 1504 08/09/23 1622   08/09/23 1515  azithromycin (ZITHROMAX) 500 mg in sodium chloride 0.9 % 250 mL IVPB        500 mg 250 mL/hr over 60 Minutes Intravenous  Once 08/09/23 1504 08/09/23 1803          Medications  arformoterol  15 mcg Nebulization BID   budesonide (PULMICORT) nebulizer solution  0.5 mg Nebulization BID   carvedilol  6.25 mg Oral BID WC   enoxaparin (LOVENOX) injection  40 mg Subcutaneous Q24H  feeding supplement  237 mL Oral TID BM   guaiFENesin  600 mg Oral BID   methylPREDNISolone (SOLU-MEDROL) injection  40 mg Intravenous Q8H   multivitamin with minerals  1 tablet Oral Daily   NIFEdipine  60 mg Oral Daily   sodium chloride flush  3 mL Intravenous Q12H      Subjective:  No new complaints from patient.  Objective:   Vitals:   08/14/23 0810 08/14/23 0823 08/14/23 1626 08/14/23 1628  BP: (!) 155/95   112/80  Pulse: 85   86  Resp: 17  17 17   Temp:      TempSrc:      SpO2: 100% 99%    Weight:      Height:        Intake/Output Summary (Last 24 hours) at 08/14/2023 1821 Last data filed at 08/14/2023 1400 Gross per 24 hour  Intake 397 ml  Output 700 ml  Net -303 ml     Wt Readings from Last 3 Encounters:  08/14/23 57.5 kg  10/01/14 77.6 kg     Exam General: Patient is cachectic.  Patient remains confused.  Not in any distress.   Cardiovascular: S1 S2  Respiratory: Decreased air entry with wheeze. Gastrointestinal: Soft and nontender. Ext: no pedal edema bilaterally Neuro: Awake and alert.   Data Reviewed:  I have personally reviewed following labs    CBC Lab Results  Component Value Date   WBC 9.8 08/14/2023   RBC 5.18 08/14/2023   HGB 18.8 (H) 08/14/2023   HCT 57.3 (H) 08/14/2023   MCV 110.6 (H)  08/14/2023   MCH 36.3 (H) 08/14/2023   PLT 150 08/14/2023   MCHC 32.8 08/14/2023   RDW 12.7 08/14/2023     Last metabolic panel Lab Results  Component Value Date   NA 154 (H) 08/14/2023   K 4.6 08/14/2023   CL 113 (H) 08/14/2023   CO2 29 08/14/2023   BUN 53 (H) 08/14/2023   CREATININE 1.25 (H) 08/14/2023   GLUCOSE 138 (H) 08/14/2023   GFRNONAA >60 08/14/2023   CALCIUM 10.8 (H) 08/14/2023   PHOS 3.1 08/12/2023   PROT 7.3 08/14/2023   ALBUMIN 3.1 (L) 08/14/2023   BILITOT 1.9 (H) 08/14/2023   ALKPHOS 76 08/14/2023   AST 24 08/14/2023   ALT 31 08/14/2023   ANIONGAP 12 08/14/2023    CBG (last 3)  No results for input(s): "GLUCAP" in the last 72 hours.    Coagulation Profile: No results for input(s): "INR", "PROTIME" in the last 168 hours.   Radiology Studies: I have personally reviewed the imaging studies  No results found.    Time spent: 35 minutes.  Barnetta Chapel M.D. [facility] 08/14/2023, 6:21 PM  Available via Epic secure chat 7am-7pm After 7 pm, please refer to night coverage provider listed on amion.

## 2023-08-14 NOTE — Progress Notes (Signed)
 Heart Failure Navigator Progress Note  Assessed for Heart & Vascular TOC clinic readiness.  Patient does not meet criteria due to per MD note patient with Dementia. .   Navigator will sign off at this time.   Rhae Hammock, BSN, Scientist, clinical (histocompatibility and immunogenetics) Only

## 2023-08-15 ENCOUNTER — Other Ambulatory Visit: Payer: Self-pay

## 2023-08-15 DIAGNOSIS — J9601 Acute respiratory failure with hypoxia: Secondary | ICD-10-CM | POA: Diagnosis not present

## 2023-08-15 LAB — COMPREHENSIVE METABOLIC PANEL
ALT: 33 U/L (ref 0–44)
AST: 27 U/L (ref 15–41)
Albumin: 3.3 g/dL — ABNORMAL LOW (ref 3.5–5.0)
Alkaline Phosphatase: 90 U/L (ref 38–126)
Anion gap: 12 (ref 5–15)
BUN: 69 mg/dL — ABNORMAL HIGH (ref 8–23)
CO2: 30 mmol/L (ref 22–32)
Calcium: 11.5 mg/dL — ABNORMAL HIGH (ref 8.9–10.3)
Chloride: 116 mmol/L — ABNORMAL HIGH (ref 98–111)
Creatinine, Ser: 1.51 mg/dL — ABNORMAL HIGH (ref 0.61–1.24)
GFR, Estimated: 48 mL/min — ABNORMAL LOW (ref >60)
Glucose, Bld: 122 mg/dL — ABNORMAL HIGH (ref 70–99)
Potassium: 5.2 mmol/L — ABNORMAL HIGH (ref 3.5–5.1)
Sodium: 158 mmol/L — ABNORMAL HIGH (ref 135–145)
Total Bilirubin: 2 mg/dL — ABNORMAL HIGH (ref 0.0–1.2)
Total Protein: 7.9 g/dL (ref 6.5–8.1)

## 2023-08-15 LAB — CBC
HCT: 62.1 % — ABNORMAL HIGH (ref 39.0–52.0)
HCT: 61.1 % — ABNORMAL HIGH (ref 39.0–52.0)
Hemoglobin: 20.3 g/dL — ABNORMAL HIGH (ref 13.0–17.0)
Hemoglobin: 19.7 g/dL — ABNORMAL HIGH (ref 13.0–17.0)
MCH: 36.6 pg — ABNORMAL HIGH (ref 26.0–34.0)
MCH: 35.9 pg — ABNORMAL HIGH (ref 26.0–34.0)
MCHC: 32.7 g/dL (ref 30.0–36.0)
MCHC: 32.2 g/dL (ref 30.0–36.0)
MCV: 111.9 fL — ABNORMAL HIGH (ref 80.0–100.0)
MCV: 111.5 fL — ABNORMAL HIGH (ref 80.0–100.0)
Platelets: 178 10*3/uL (ref 150–400)
Platelets: 166 10*3/uL (ref 150–400)
RBC: 5.55 MIL/uL (ref 4.22–5.81)
RBC: 5.48 MIL/uL (ref 4.22–5.81)
RDW: 12.9 % (ref 11.5–15.5)
RDW: 12.7 % (ref 11.5–15.5)
WBC: 14.5 10*3/uL — ABNORMAL HIGH (ref 4.0–10.5)
WBC: 13.6 10*3/uL — ABNORMAL HIGH (ref 4.0–10.5)
nRBC: 0 % (ref 0.0–0.2)
nRBC: 0 % (ref 0.0–0.2)

## 2023-08-15 LAB — BASIC METABOLIC PANEL
Anion gap: 6 (ref 5–15)
BUN: 67 mg/dL — ABNORMAL HIGH (ref 8–23)
CO2: 30 mmol/L (ref 22–32)
Calcium: 10.9 mg/dL — ABNORMAL HIGH (ref 8.9–10.3)
Chloride: 119 mmol/L — ABNORMAL HIGH (ref 98–111)
Creatinine, Ser: 1.53 mg/dL — ABNORMAL HIGH (ref 0.61–1.24)
GFR, Estimated: 48 mL/min — ABNORMAL LOW (ref >60)
Glucose, Bld: 155 mg/dL — ABNORMAL HIGH (ref 70–99)
Potassium: 4.7 mmol/L (ref 3.5–5.1)
Sodium: 155 mmol/L — ABNORMAL HIGH (ref 135–145)

## 2023-08-15 LAB — MAGNESIUM: Magnesium: 3.2 mg/dL — ABNORMAL HIGH (ref 1.7–2.4)

## 2023-08-15 MED ORDER — SODIUM CHLORIDE 0.9% FLUSH: 10.0000 mL | INTRAVENOUS | Status: DC | PRN

## 2023-08-15 MED ORDER — SODIUM CHLORIDE 0.9% FLUSH
10.0000 mL | Freq: Two times a day (BID) | INTRAVENOUS | Status: DC
Start: 2023-08-15 — End: 2023-08-27
  Administered 2023-08-17 – 2023-08-24 (×10): 10 mL

## 2023-08-15 MED ORDER — SODIUM CHLORIDE 0.9% FLUSH
10.0000 mL | Freq: Two times a day (BID) | INTRAVENOUS | Status: DC
  Administered 2023-08-17 – 2023-08-24 (×7): 10 mL

## 2023-08-15 MED ORDER — GUAIFENESIN 100 MG/5ML PO LIQD
15.0000 mL | Freq: Four times a day (QID) | ORAL | Status: DC
  Administered 2023-08-15 – 2023-08-25 (×24): 15 mL via ORAL
  Filled 2023-08-15 (×31): qty 15

## 2023-08-15 NOTE — TOC Progression Note (Addendum)
 Transition of Care [facility]) - Progression Note    Patient Details  Name: Francisco Garcia MRN: [id number] Date of Birth: January 28, 1951  [facility] CM/SW Contact  Ellyssa Zagal A [place], LCSW Phone Number: 08/15/2023, 12:56 PM  Clinical Narrative:     Update 1522 CSW was contacted by Roanna Raider at [facility]. They stated they are reviewing pt for short term rehab and will get back to CSW with possible bed offer.   CSW contacted pt's son regarding bed choice. CSW provided bed offers, with Medicare.gov ratings. Requested CSW follow up with [facility] as they prefer a [employer] facility. CSW waiting response regarding bed offer.   TOC will continue to follow.        Expected Discharge Plan and Services                                               Social Determinants of Health (SDOH) Interventions SDOH Screenings   Food Insecurity: Patient Unable To Answer (08/09/2023)  Housing: Patient Unable To Answer (08/09/2023)  Transportation Needs: Patient Unable To Answer (08/09/2023)  Utilities: Patient Unable To Answer (08/09/2023)  Tobacco Use: Medium Risk (08/09/2023)    Readmission Risk Interventions     No data to display

## 2023-08-15 NOTE — Plan of Care (Signed)
 Pt is confused, VSS, continues on 3L O2, nasal cannula, continuous pulse ox in use. no respiratory distress noted.  Safety maintained. Bed alarm on. Call bell in reach. Will continue to monitor.    Problem: Education: Goal: Knowledge of General Education information will improve Description: Including pain rating scale, medication(s)/side effects and non-pharmacologic comfort measures Outcome: Progressing   Problem: Health Behavior/Discharge Planning: Goal: Ability to manage health-related needs will improve Outcome: Progressing   Problem: Clinical Measurements: Goal: Ability to maintain clinical measurements within normal limits will improve Outcome: Progressing Goal: Will remain free from infection Outcome: Progressing Goal: Diagnostic test results will improve Outcome: Progressing Goal: Respiratory complications will improve Outcome: Progressing Goal: Cardiovascular complication will be avoided Outcome: Progressing   Problem: Activity: Goal: Risk for activity intolerance will decrease Outcome: Progressing   Problem: Nutrition: Goal: Adequate nutrition will be maintained Outcome: Progressing   Problem: Coping: Goal: Level of anxiety will decrease Outcome: Progressing   Problem: Elimination: Goal: Will not experience complications related to bowel motility Outcome: Progressing Goal: Will not experience complications related to urinary retention Outcome: Progressing   Problem: Pain Managment: Goal: General experience of comfort will improve and/or be controlled Outcome: Progressing   Problem: Safety: Goal: Ability to remain free from injury will improve Outcome: Progressing   Problem: Skin Integrity: Goal: Risk for impaired skin integrity will decrease Outcome: Progressing

## 2023-08-15 NOTE — Progress Notes (Signed)
 Pt lost IV access and unable to give morning dose IV solumedrol . IV team consult placed and waiting for IV placement. Report given to day RN for continuation of care.

## 2023-08-15 NOTE — Progress Notes (Addendum)
 Calorie Count Note  Patient doing well on pureed diet, meeting 81% of his calorie and 96% of his protein needs. Had 100% of his waffles and eggs this morning. RD will discontinue calorie count and encourage PO intake.   48 hour calorie count ordered.  Diet: Dysphagia 1 diet, thin liquids  Supplements: Ensure Enlive, Magic cup  3/24: Breakfast: 25% , 100 kcal, 4 gm protein  Lunch: 100% 520 kcal, 38 gm protein  Dinner: No meal ticket  Supplements: 50% magic cup, 2 Ensures, 845 kcal, 45 gm protein   Total intake: 1465  kcal (81% of minimum estimated needs)  87 gm protein (96% of minimum estimated needs)  Estimated Nutritional Needs:  Kcal:  1800-2000 Protein:  90-110 grams Fluid:  >/= 1.8 L  INTERVENTION:  Continue dysphagia 1 diet with feeding assistance  Discontinue calorie count due to adequate PO intake  Ensure Enlive to TID. Each supplement provides 350 kcal and 20 grams of protein. Magic cup BID with meals, each supplement provides 290 kcal and 9 grams of protein  Continue MVI with minerals.   NUTRITION DIAGNOSIS:    Severe Malnutrition related to chronic illness as evidenced by severe fat depletion, severe muscle depletion.   GOAL:    Patient will meet greater than or equal to 90% of their needs  Elliot Dally, RD Registered Dietitian  See Amion for more information

## 2023-08-15 NOTE — Progress Notes (Signed)

## 2023-08-15 NOTE — Plan of Care (Signed)
  Problem: Clinical Measurements: Goal: Will remain free from infection Outcome: Progressing   Problem: Activity: Goal: Risk for activity intolerance will decrease Outcome: Progressing   Problem: Nutrition: Goal: Adequate nutrition will be maintained Outcome: Progressing   Problem: Skin Integrity: Goal: Risk for impaired skin integrity will decrease Outcome: Progressing   

## 2023-08-15 NOTE — Progress Notes (Signed)
 Progress Note   Patient: Francisco Garcia [id number] DOB: 1950/09/10 DOA: 08/09/2023     6 DOS: the patient was seen and examined on 08/15/2023   Brief hospital course: Patient is a 73 year old male with history of COPD, CAD status post CABG, HTN, PAD, and CA bladder.  Patient presented to ER with fall/      syncope.  Patient is a poor historian, apparently was last seen normal a day before per ER provider who spoke with the family.  His speech was noted to be abnormal as well.  In the ER, he was found to be short of breath, bilateral diffuse rhonchi.  On presentation, serum creatinine was 1.4, lactic acidosis 3.0, CK 750.  Chest x-ray revealed new heterogenous opacity overlying the medial aspect of the right upper lung zone concerning for consolidation.  CT head C-spine revealed no CT evidence of acute intracranial abnormality, no acute fracture or traumatic malalignment of C-spine.  Interlobular septal thickening which could reflect pulmonary edema, emphysema.  Left shoulder x-ray showed chronic longstanding rotator cuff tear, mild glenohumeral osteoarthritis.  MRI brain revealed no acute intracranial abnormality.  Age-related simple atrophy with mild chronic small vessel ischemic disease reported.  Right upper quadrant ultrasound revealed subtle liver surface nodularity concerning for cirrhosis, no discrete focal liver lesion.  Small amount of gallbladder sludge   08/14/2023: Patient seen.  Patient stable.  Pursue disposition.  Assessment and Plan:  Acute respiratory failure with hypoxia (HCC):  -multifactorial secondary to community-acquired pneumonia, COPD with acute exacerbation, acute CHF/pulmonary edema.  Procalcitonin 0.5 -On 2 L O2 via [place], continue management of PNA, COPD, CHF 08/14/2023: Echocardiogram revealed EF of 30 to 35%.  Optimize volume.  Cardiology input is appreciated    Active Problems: CAP (community acquired pneumonia) 08/13/2023: Patient has completed course of antibiotics  today.   COPD with acute exacerbation (HCC) -Continue scheduled DuoNebs, Pulmicort, Brovana, Mucinex -continue IV Solu-Medrol 40 mg every 8 hours       Acute CHF (congestive heart failure) (HCC) -BNP elevated, 1312. -Cannot start GDMT.  Low EF seems to be new.  Echocardiogram done in 2023 revealed EF of 60%.  History of paroxysmal atrial fibrillation and coronary artery disease noted.   -Cardiology input is appreciated.     Lactic acidosis, fall with mild rhabdomyolysis -Patient received IV fluid bolus and continuous fluids in ED, lactic acid was 3.0 on admission, improved to 1.4   Acute kidney injury - IV D5W 100 cc/hr   Acute metabolic encephalopathy -Likely has underlying dementia, B12, folate normal, ammonia level normal, TSH 1.3 -No FND's, MRI of the brain showed no acute intracranial abnormality.  Age-related cerebral atrophy with mild chronic small vessel ischemic disease. -On Geodon prn -Sodium of 154 today. -Free water if possible.   Hypertensive urgency -Resolved. -Blood pressure is back to normal.     Hyperlipidemia -Hold statins due to rhabdomyolysis   Hypernatremia: - IV D5W as above   Subjective: Pt seen and examined at the bedside. IV access has been an ongoing issue for this pt today. As a result he has not been receiving the IV fluids consistently which has caused his Na+ to remain elevated. Order for midline has been placed.  If unable to proceed with midline may proceed with PICC.  Physical Exam: Vitals:   08/15/23 0535 08/15/23 0758 08/15/23 0915 08/15/23 1552  BP:  126/71  112/83  Pulse:  94  93  Resp:  17  17  Temp:  (!) 97.4 F (36.3 C)  97.7 F (36.5 C)  TempSrc:  Oral  Oral  SpO2:  99% 94% 94%  Weight: 58.5 kg     Height:       Physical Exam Constitutional:      Comments: Awake but not oriented  HENT:     Head: Normocephalic.     Mouth/Throat:     Mouth: Mucous membranes are dry.  Cardiovascular:     Rate and Rhythm: Normal rate.   Pulmonary:     Effort: Pulmonary effort is normal.     Breath sounds: Wheezing and rhonchi present.  Abdominal:     Palpations: Abdomen is soft.  Musculoskeletal:     Cervical back: Neck supple.  Skin:    General: Skin is warm.  Neurological:     Mental Status: He is disoriented.      Disposition: Status is: Inpatient Remains inpatient appropriate because: awaiting safe disposition  Planned Discharge Destination:  Dispo per pt clinical course    Time spent: 35 minutes  Author: Baron Hamper , MD 08/15/2023 4:57 PM  For on call review [email or web address].

## 2023-08-16 ENCOUNTER — Inpatient Hospital Stay (HOSPITAL_COMMUNITY)

## 2023-08-16 DIAGNOSIS — J9601 Acute respiratory failure with hypoxia: Secondary | ICD-10-CM | POA: Diagnosis not present

## 2023-08-16 LAB — CBC
HCT: 58.4 % — ABNORMAL HIGH (ref 39.0–52.0)
Hemoglobin: 18.7 g/dL — ABNORMAL HIGH (ref 13.0–17.0)
MCH: 36.7 pg — ABNORMAL HIGH (ref 26.0–34.0)
MCHC: 32 g/dL (ref 30.0–36.0)
MCV: 114.5 fL — ABNORMAL HIGH (ref 80.0–100.0)
Platelets: 164 10*3/uL (ref 150–400)
RBC: 5.1 MIL/uL (ref 4.22–5.81)
RDW: 12.7 % (ref 11.5–15.5)
WBC: 12.6 10*3/uL — ABNORMAL HIGH (ref 4.0–10.5)
nRBC: 0 % (ref 0.0–0.2)

## 2023-08-16 LAB — COMPREHENSIVE METABOLIC PANEL
ALT: 30 U/L (ref 0–44)
AST: 25 U/L (ref 15–41)
Albumin: 2.9 g/dL — ABNORMAL LOW (ref 3.5–5.0)
Alkaline Phosphatase: 82 U/L (ref 38–126)
Anion gap: 7 (ref 5–15)
BUN: 70 mg/dL — ABNORMAL HIGH (ref 8–23)
CO2: 32 mmol/L (ref 22–32)
Calcium: 11.1 mg/dL — ABNORMAL HIGH (ref 8.9–10.3)
Chloride: 114 mmol/L — ABNORMAL HIGH (ref 98–111)
Creatinine, Ser: 1.48 mg/dL — ABNORMAL HIGH (ref 0.61–1.24)
GFR, Estimated: 50 mL/min — ABNORMAL LOW (ref >60)
Glucose, Bld: 126 mg/dL — ABNORMAL HIGH (ref 70–99)
Potassium: 5 mmol/L (ref 3.5–5.1)
Sodium: 153 mmol/L — ABNORMAL HIGH (ref 135–145)
Total Bilirubin: 1.9 mg/dL — ABNORMAL HIGH (ref 0.0–1.2)
Total Protein: 6.9 g/dL (ref 6.5–8.1)

## 2023-08-16 LAB — MAGNESIUM: Magnesium: 3.2 mg/dL — ABNORMAL HIGH (ref 1.7–2.4)

## 2023-08-16 NOTE — Procedures (Signed)
 Modified Barium Swallow Study  Patient Details  Name: Francisco Garcia MRN: [id number] Date of Birth: 22-Nov-1950  Today's Date: 08/16/2023  Modified Barium Swallow completed.  Full report located under Chart Review in the Imaging Section.  History of Present Illness Patient is a 73 y.o. 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 He presented to the hospital on 08/09/23 for evaluation following fall/syncope, Barrett's esophagus, GERD. SLP swallow evaluation ordered after RN observed him to get choked and turn purple after a bite of food and sip of juice. Per RD, patient reportedly did well with eating previous date.   Clinical Impression Patient presents with an oropharyngeal dysphagia as per this MBS. During oral phase, anterior to posterior transit of liquids and solids was delayed, but with puree and mechanical soft solids being moderately delayed. Mastication of soft solid was significantly prolonged, followed by prolonged transit of bolus. Laryngeal elevation, anterior hyoid excursion and epiglottic inversion all partial in completion. Laryngeal vestibule closure was incomplete. Penetration that did not clear the laryngeal vestibule (PAS 3) occured with thin and nectar thick liquids but aspiration after the swallow from prior penetration only occured with thin liquids (PAS 5, 8). Pharyngeal residuals remained after swallows with liquids and solids, with mild-moderate amount of residuals remaining in vallecular sinus, and mild on posterior pharyngeal wall and pyriform sinus.  DIGEST Swallow Severity Rating*  Safety: 2  Efficiency: 1  Overall Pharyngeal Swallow Severity: 2 1: mild; 2: moderate; 3: severe; 4: profound  *The Dynamic Imaging Grade of Swallowing Toxicity is standardized for the head and neck cancer population, however, demonstrates promising clinical applications across populations to standardize the clinical rating of  pharyngeal swallow safety and severity.  Factors that may increase risk of adverse event in presence of aspiration [name] & [name] 2021): Poor general health and/or compromised immunity;Reduced cognitive function;Weak cough;Frail or deconditioned  Swallow Evaluation Recommendations Recommendations: PO diet PO Diet Recommendation: Dysphagia 1 (Pureed);Mildly thick liquids (Level 2, nectar thick) Liquid Administration via: Cup;Straw Medication Administration: Crushed with puree Supervision: Full assist for feeding;Full supervision/cueing for swallowing strategies Swallowing strategies  : Small bites/sips;Slow rate;Check for pocketing or oral holding Postural changes: Position pt fully upright for meals Oral care recommendations: Oral care BID (2x/day)      Angela Nevin, MA, CCC-SLP Speech Therapy

## 2023-08-16 NOTE — Evaluation (Signed)
 Clinical/Bedside Swallow Evaluation Patient Details  Name: Francisco Garcia MRN: [id number] Date of Birth: [birth year]  Today's Date: 08/16/2023 Time: SLP Start Time (ACUTE ONLY): 1210 SLP Stop Time (ACUTE ONLY): 1225 SLP Time Calculation (min) (ACUTE ONLY): 15 min  Past Medical History: History reviewed. No pertinent past medical history. Past Surgical History: History reviewed. No pertinent surgical history. HPI:  Patient is a 73 y.o. 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 He presented to the hospital on 08/09/23 for evaluation following fall/syncope, Barrett's esophagus, GERD. SLP swallow evaluation ordered after RN observed him to get choked and turn purple after a bite of food and sip of juice. Per RD, patient reportedly did well with eating previous date.    Assessment / Plan / Recommendation  Clinical Impression  Patient presents with clinical s/s of dysphagia as per this bedside swallow evaluation. Swallow initiation was delayed will thin liquid and puree solid boluses and SLP suspects decreased hyolaryngeal elevation. Patient with delayed cough after sips of thin liquids, with cough sounding congested. Patient's cough response was not productive to mobilize secretions. SLP recommending to proceed with MBS to evaluate swallow function. SLP Visit Diagnosis: Dysphagia, unspecified (R13.10)    Aspiration Risk  Mild aspiration risk;Moderate aspiration risk    Diet Recommendation Other (Comment) (will recommend PO's after MBS completed)    Postural Changes: Seated upright at 90 degrees    Other  Recommendations Oral Care Recommendations: Oral care prior to ice chip/H20;Oral care BID;Staff/trained caregiver to provide oral care    Recommendations for follow up therapy are one component of a multi-disciplinary discharge planning process, led by the attending physician.  Recommendations may be updated based on patient status,  additional functional criteria and insurance authorization.  Follow up Recommendations Skilled nursing-short term rehab (<3 hours/day)      Assistance Recommended at Discharge    Functional Status Assessment Patient has had a recent decline in their functional status and demonstrates the ability to make significant improvements in function in a reasonable and predictable amount of time.  Frequency and Duration min 2x/week  1 week       Prognosis Prognosis for improved oropharyngeal function: Good Barriers to Reach Goals: Cognitive deficits      Swallow Study   General Date of Onset: 08/16/23 HPI: Patient is a 73 y.o. 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 He presented to the hospital on 08/09/23 for evaluation following fall/syncope, Barrett's esophagus, GERD. SLP swallow evaluation ordered after RN observed him to get choked and turn purple after a bite of food and sip of juice. Per RD, patient reportedly did well with eating previous date. Type of Study: Bedside Swallow Evaluation Previous Swallow Assessment: none found Diet Prior to this Study: Dysphagia 1 (pureed);Thin liquids (Level 0) Temperature Spikes Noted: No Respiratory Status: Room air History of Recent Intubation: No Behavior/Cognition: Alert;Cooperative;Pleasant mood;Confused Oral Cavity Assessment: Dry Oral Care Completed by SLP: Yes Oral Cavity - Dentition: Edentulous Self-Feeding Abilities: Total assist Patient Positioning: Upright in bed Baseline Vocal Quality: Normal Volitional Cough: Cognitively unable to elicit Volitional Swallow: Unable to elicit    Oral/Motor/Sensory Function Overall Oral Motor/Sensory Function: Other (comment) (no focal weakness seen, patient unable to follow directions)   Ice Chips     Thin Liquid Thin Liquid: Impaired Presentation: Cup;Straw Pharyngeal  Phase Impairments: Suspected delayed Swallow;Decreased hyoid-laryngeal  movement;Cough - Delayed    Nectar Thick  Honey Thick     Puree Puree: Impaired Pharyngeal Phase Impairments: Suspected delayed Swallow;Decreased hyoid-laryngeal movement   Solid     Solid: Not tested     Angela Nevin, MA, CCC-SLP Speech Therapy

## 2023-08-16 NOTE — Progress Notes (Signed)
 Nutrition Follow-up  DOCUMENTATION CODES:   Severe malnutrition in context of chronic illness  INTERVENTION:   Continue dysphagia 1 diet, nectar thick liquids with feeding assistance  Ensure Enlive to TID. Each supplement provides 350 kcal and 20 grams of protein. * Has to be thickened to nectar thick consistency  Magic cup BID with meals, each supplement provides 290 kcal and 9 grams of protein  Continue MVI with minerals. NUTRITION DIAGNOSIS:   Severe Malnutrition related to chronic illness as evidenced by severe fat depletion, severe muscle depletion.  - Still applicable   GOAL:   Patient will meet greater than or equal to 90% of their needs  - Ongoing   MONITOR:   PO intake, Supplement acceptance, Labs, I & O's, Weight trends  REASON FOR ASSESSMENT:   Consult Assessment of nutrition requirement/status  ASSESSMENT:   73 y.o. male presented to the ED with fall/syncope. PMH includes COPD, CAD s/p CABG, HTN, PAD, bladder cancer, and CHF. Pt admitted with acute respiratory failure 2/2 PNA, altered mental status, and AKI.  Received message from RN that patient choked and turned purple after taking sips of juice an bites of food. RD alerted MD and SLP. SLP performed MBS and recommended downgrading liquids to nectar thick and continuing purees. Patient was doing well yesterday and was meeting 81% of his calorie and 96% of his protein intake.   Sodium high, has not been able to receive IVF due to issues with IV access.   Admit weight: 66.7 kg Current weight: 58.1 kg    Average Meal Intake: 3/24-3/26: 45% intake x 3 recorded meals  Nutritionally Relevant Medications: Scheduled Meds:  enoxaparin (LOVENOX) injection  40 mg Subcutaneous Q24H   feeding supplement  237 mL Oral TID BM   multivitamin with minerals  1 tablet Oral Daily   Continuous Infusions:  dextrose 100 mL/hr at 08/16/23 0000   Labs Reviewed: Sodium 153, BUN 70, Creatinine 1.48, calcium 11.1, Magnesium  3.2,   Diet Order:   Diet Order             DIET - DYS 1 Room service appropriate? No; Fluid consistency: Nectar Thick  Diet effective now                   EDUCATION NEEDS:   Education needs have been addressed  Skin:  Skin Assessment: Reviewed RN Assessment  Last BM:  08/13/23 type 4  Height:   Ht Readings from Last 1 Encounters:  08/09/23 5\' 7"  (1.702 m)    Weight:   Wt Readings from Last 1 Encounters:  08/16/23 58.1 kg    Ideal Body Weight:  67.3 kg  BMI:  Body mass index is 20.06 kg/m.  Estimated Nutritional Needs:   Kcal:  1800-2000  Protein:  90-110 grams  Fluid:  >/= 1.8 L   Elliot Dally, RD Registered Dietitian  See Amion for more information

## 2023-08-16 NOTE — Progress Notes (Addendum)
 Progress Note   Patient: Francisco Garcia ZOX:[id number] DOB: 16-Feb-1951 DOA: 08/09/2023     7 DOS: the patient was seen and examined on 08/16/2023   Brief hospital course: Patient is a 73 year old male with history of COPD, CAD status post CABG, HTN, PAD, and CA bladder.  Patient presented to ER with fall/      syncope.  Patient is a poor historian, apparently was last seen normal a day before per ER provider who spoke with the family.  His speech was noted to be abnormal as well.  In the ER, he was found to be short of breath, bilateral diffuse rhonchi.  On presentation, serum creatinine was 1.4, lactic acidosis 3.0, CK 750.  Chest x-ray revealed new heterogenous opacity overlying the medial aspect of the right upper lung zone concerning for consolidation.  CT head C-spine revealed no CT evidence of acute intracranial abnormality, no acute fracture or traumatic malalignment of C-spine.  Interlobular septal thickening which could reflect pulmonary edema, emphysema.  Left shoulder x-ray showed chronic longstanding rotator cuff tear, mild glenohumeral osteoarthritis.  MRI brain revealed no acute intracranial abnormality.  Age-related simple atrophy with mild chronic small vessel ischemic disease reported.  Right upper quadrant ultrasound revealed subtle liver surface nodularity concerning for cirrhosis, no discrete focal liver lesion.  Small amount of gallbladder sludge   08/14/2023: Patient seen.  Patient stable.  Pursue disposition.  Assessment and Plan: Acute respiratory failure with hypoxia (HCC):  -multifactorial secondary to community-acquired pneumonia, COPD with acute exacerbation, acute CHF/pulmonary edema.  Procalcitonin 0.5 -On 2 L O2 via [place], continue management of PNA, COPD, CHF 08/14/2023: Echocardiogram revealed EF of 30 to 35%.  Optimize volume.  Cardiology input is appreciated  - Robitussin 15 mL PO q6 hr  - Xopenex PRN   CAP (community acquired pneumonia; BACTERIAL) - Patient has  completed course of antibiotics  - Pulmicort/brovana bid    COPD with acute exacerbation (HCC) -Continue scheduled DuoNebs, Pulmicort/Brovana -continue IV Solu-Medrol 40 mg every 8 hours     Acute CHF (congestive heart failure) (HCC) - Coreg 6.25 mg PO bid  -Cardiology input is appreciated.     Lactic acidosis, fall with mild rhabdomyolysis - Resolved   Acute kidney injury - IV D5W 100 cc/hr   Acute metabolic encephalopathy -Likely has underlying dementia, B12, folate normal, ammonia level normal, TSH 1.3 -No FND's, MRI of the brain showed no acute intracranial abnormality.  Age-related cerebral atrophy with mild chronic small vessel ischemic disease. -On Geodon prn   Hypertensive urgency - Coreg as above  - Nifedipine 60 mg PO daily    Hyperlipidemia -Hold statins due to rhabdomyolysis   Hypernatremia: - IV D5W as above   Severe malnutrition  - Complicating care   Hypercalcemia  - Not clinically significant    Subjective: Pt seen and examined at the bedside. Speech therapy consulted today as nursing staff was concerned for choking.  S/p midline access yesterday. Na+ downtrending 155 -->153.  Physical Exam: Vitals:   08/15/23 1938 08/16/23 0430 08/16/23 0500 08/16/23 0919  BP: 129/89 133/89  (!) 114/90  Pulse: 82 84  80  Resp: 18 18  20   Temp: (!) 97.4 F (36.3 C) 97.9 F (36.6 C)  (!) 97.5 F (36.4 C)  TempSrc: Oral   Axillary  SpO2: 99%   96%  Weight:   58.1 kg   Height:       Constitutional:      Comments: Awake but not oriented  HENT:  Head: Normocephalic.     Mouth/Throat:     Mouth: Mucous membranes are dry.  Cardiovascular:     Rate and Rhythm: Normal rate.  Pulmonary:     Effort: Pulmonary effort is normal.     Breath sounds: Wheezing and rhonchi present.  Abdominal:     Palpations: Abdomen is soft.  Musculoskeletal:     Cervical back: Neck supple.  Skin:    General: Skin is warm.  Neurological:     Mental Status: He is disoriented.       Disposition: Status is: Inpatient Remains inpatient appropriate because: serial labs and IV fluids.  Planned Discharge Destination:  Dispo per pt's clinical course    Time spent: 35 minutes  Author: Baron Hamper , MD 08/16/2023 1:23 PM  For on call review [email or web address].

## 2023-08-16 NOTE — Plan of Care (Signed)
   Problem: Education: Goal: Knowledge of General Education information will improve Description: Including pain rating scale, medication(s)/side effects and non-pharmacologic comfort measures Outcome: Not Progressing   Problem: Health Behavior/Discharge Planning: Goal: Ability to manage health-related needs will improve Outcome: Not Progressing

## 2023-08-16 NOTE — Progress Notes (Signed)
 Messaged Francisco Garcia with pharmacy and Dr Nelda Severe to notify that patient has mucinex and nifedipine ordered, both of which say med is not supposed to be crushed. Patient can only swallow medications crushed with applesauce. MD notified that these meds have not been given.   Dr Nelda Severe and dietician notified that the tech and RN were at bedside and pt was given one bite of food, and one sip of a juice and patient began to choke, and turned purple. Pt was able to cough when patted on the back. Concern for need for SLP consult. Pt then had SLP see him and barium swallow done. Pt doing well with nectar thick liquids.

## 2023-08-16 NOTE — TOC Progression Note (Signed)
 Transition of Care [facility]) - Progression Note    Patient Details  Name: Francisco Garcia MRN: [id number] Date of Birth: December 08, 1950  [facility] CM/SW Contact  Fionnuala Hemmerich A [place], LCSW Phone Number: 08/16/2023, 2:12 PM  Clinical Narrative:     CSW contacted Sherri at [facility]. She said that her DON was still reviewing pt for placement and would update CSW on decision later today.   CSW to reach out to pt's son, Pranav and provide update once determination has been provided by facility to CSW.    TOC will continue to follow.        Expected Discharge Plan and Services                                               Social Determinants of Health (SDOH) Interventions SDOH Screenings   Food Insecurity: Patient Unable To Answer (08/09/2023)  Housing: Patient Unable To Answer (08/09/2023)  Transportation Needs: Patient Unable To Answer (08/09/2023)  Utilities: Patient Unable To Answer (08/09/2023)  Tobacco Use: Medium Risk (08/09/2023)    Readmission Risk Interventions     No data to display

## 2023-08-17 DIAGNOSIS — J9601 Acute respiratory failure with hypoxia: Secondary | ICD-10-CM | POA: Diagnosis not present

## 2023-08-17 LAB — COMPREHENSIVE METABOLIC PANEL WITH GFR
ALT: 30 U/L (ref 0–44)
AST: 26 U/L (ref 15–41)
Albumin: 2.7 g/dL — ABNORMAL LOW (ref 3.5–5.0)
Alkaline Phosphatase: 71 U/L (ref 38–126)
Anion gap: 5 (ref 5–15)
BUN: 47 mg/dL — ABNORMAL HIGH (ref 8–23)
CO2: 31 mmol/L (ref 22–32)
Calcium: 10.2 mg/dL (ref 8.9–10.3)
Chloride: 111 mmol/L (ref 98–111)
Creatinine, Ser: 1.31 mg/dL — ABNORMAL HIGH (ref 0.61–1.24)
GFR, Estimated: 57 mL/min — ABNORMAL LOW (ref >60)
Glucose, Bld: 145 mg/dL — ABNORMAL HIGH (ref 70–99)
Potassium: 5.3 mmol/L — ABNORMAL HIGH (ref 3.5–5.1)
Sodium: 147 mmol/L — ABNORMAL HIGH (ref 135–145)
Total Bilirubin: 2.4 mg/dL — ABNORMAL HIGH (ref 0.0–1.2)
Total Protein: 6.5 g/dL (ref 6.5–8.1)

## 2023-08-17 MED ORDER — DEXTROSE-SODIUM CHLORIDE 5-0.45 % IV SOLN: INTRAVENOUS | Status: AC

## 2023-08-17 MED ORDER — METHYLPREDNISOLONE SODIUM SUCC 40 MG IJ SOLR
40.0000 mg | Freq: Every day | INTRAMUSCULAR | Status: DC
  Administered 2023-08-18: 40 mg via INTRAVENOUS
  Filled 2023-08-17: qty 1

## 2023-08-17 NOTE — Plan of Care (Signed)

## 2023-08-17 NOTE — Progress Notes (Signed)
 Physical Therapy Treatment Patient Details Name: Francisco Garcia MRN: [id number] DOB: [birth year] Today's Date: 08/17/2023   History of Present Illness 73 year old male presented to ER 3/19 with fall, syncope, Acute respiratory failure with hypoxia, CAP (community acquired pneumonia), COPD with acute exacerbation (HCC), Acute CHF (congestive heart failure), Lactic acidosis, fall with mild rhabdomyolysis. PMHx: COPD, CAD status post CABG, HTN, PAD, CA bladder.    PT Comments  Pt received in supine, agreeable to therapy session with encouragement, with emphasis on seated/standing balance. Pt needing up to maxA for sit<>stand from elevated bed<>Stedy and demonstrates L lean while seated, needing min to modA for seated balance. Pt is food motivated and with improved participation when encouraged to sit up to take bites of food/sips of his drink. RN notified pt will need assist to eat every meal and reminder that he has active calorie count. Patient will benefit from continued inpatient follow up therapy, <3 hours/day, pt appears below functional baseline at this time but difficult to assess prior level of function as no family present to confirm and pt is not reliable reporter. Pt is making good progress toward goals and participating better each session.   If plan is discharge home, recommend the following: Two people to help with walking and/or transfers;A lot of help with bathing/dressing/bathroom;Assistance with cooking/housework;Direct supervision/assist for medications management;Direct supervision/assist for financial management;Assist for transportation;Supervision due to cognitive status   Can travel by private vehicle     No  Equipment Recommendations  None recommended by PT    Recommendations for Other Services OT consult (further cognitive work-up)     Precautions / Restrictions Precautions Precautions: Fall Recall of Precautions/Restrictions: Impaired Precaution/Restrictions  Comments: monitor O2 Restrictions Weight Bearing Restrictions Per Provider Order: No     Mobility  Bed Mobility Overal bed mobility: Needs Assistance Bed Mobility: Rolling, Sidelying to Sit, Sit to Supine Rolling: Max assist Sidelying to sit: Max assist, +2 for safety/equipment, HOB elevated   Sit to supine: Mod assist   General bed mobility comments: Assist to bring legs off of bed, elevate trunk into sitting and bring hips to EOB. Assist to bring legs back up into bed.    Transfers Overall transfer level: Needs assistance Equipment used: Ambulation equipment used Transfers: Sit to/from Stand Sit to Stand: Max assist, +2 physical assistance, From elevated surface, Via lift equipment           General transfer comment: STS from EOB<>Stedy x2 reps and x1 rep from elevated Stedy flaps. Good effort from pt to stand but quick to fatigue, standing <30 seconds each attempt. Bed linens noted to be wet when pt stood up so new pad placed under him and RN notified he will need bed bath/full linen change; gown also changed. Transfer via Lift Equipment: Stedy  Ambulation/Gait             Pre-gait activities: pt unable to follow instructions for standing hip flexion/weight shifting in [place] today     Stairs             Wheelchair Mobility     Tilt Bed    Modified Rankin (Stroke Patients Only)       Balance Overall balance assessment: Needs assistance Sitting-balance support: Feet supported, Bilateral upper extremity supported Sitting balance-Leahy Scale: Poor Sitting balance - Comments: L lean; mostly minA for safety/stability Postural control: Left lateral lean Standing balance support: Bilateral upper extremity supported, Reliant on assistive device for balance Standing balance-Leahy Scale: Poor Standing balance comment: up to +2 assist  for static standing in [place]                            Communication Communication Communication:  Impaired Factors Affecting Communication: Difficulty expressing self;Reduced clarity of speech (poor dentition, accent and dysarthria)  Cognition Arousal: Alert Behavior During Therapy: Flat affect   PT - Cognitive impairments: No family/caregiver present to determine baseline, Difficult to assess Difficult to assess due to: Impaired communication                     PT - Cognition Comments: Difficulty assessing pt orientation due to poor dentition and dysarthric speech. Pt appears oriented to self but not to situation/location. He is able to say his daughter and son's names with increased time. Pt given soft touch call bell to use while in bed due to use of mitts and pt not able to effectively use regular call bell. Following commands: Impaired Following commands impaired: Follows one step commands with increased time    Cueing Cueing Techniques: Verbal cues, Gestural cues, Tactile cues  Exercises Other Exercises Other Exercises: STS x 3 reps and static standing ~30 sec each rep for BLE/BUE strengthening. Rest breaks to recover between attempts. Other Exercises: Bed "crunches" x5 reps with BUE support    General Comments General comments (skin integrity, edema, etc.): No acute s/sx distress during session on RA (poor signal on portable pulse oximeter on his finger); Pt had not eaten lunch which was on his tray so pt assisted to take bites of his magic cup and sips of his nectar thick liquid while sitting EOB and once in bed chair posture at end of session. with mitts off, pt able to self-feed but needs some assist for more appropriate bite sizes and encouragement to slow down his pace so he swallows well between bites/sips. RN notified he will still need assist to eat when dinner arrives.      Pertinent Vitals/Pain Pain Assessment Pain Assessment: PAINAD Breathing: normal Negative Vocalization: none Facial Expression: smiling or inexpressive Body Language: tense, distressed  pacing, fidgeting Consolability: distracted or reassured by voice/touch PAINAD Score: 2 Pain Location: not localized Pain Intervention(s): Monitored during session, Repositioned, Limited activity within patient's tolerance    Home Living                          Prior Function            PT Goals (current goals can now be found in the care plan section) Acute Rehab PT Goals Patient Stated Goal: none stated PT Goal Formulation: Patient unable to participate in goal setting Time For Goal Achievement: 08/25/23 Progress towards PT goals: Progressing toward goals    Frequency    Min 2X/week      PT Plan      Co-evaluation              AM-PAC PT "6 Clicks" Mobility   Outcome Measure  Help needed turning from your back to your side while in a flat bed without using bedrails?: A Little Help needed moving from lying on your back to sitting on the side of a flat bed without using bedrails?: A Lot Help needed moving to and from a bed to a chair (including a wheelchair)?: Total Help needed standing up from a chair using your arms (e.g., wheelchair or bedside chair)?: A Lot (+2 safety) Help needed to walk in hospital  room?: Total Help needed climbing 3-5 steps with a railing? : Total 6 Click Score: 10    End of Session Equipment Utilized During Treatment: Gait belt Activity Tolerance: Patient tolerated treatment well Patient left: in bed;with call bell/phone within reach;with bed alarm set;Other (comment) (bed in chair posture, pillow behind L hip as wedge to promote neutral posture and another behind his head; mitts donned) Nurse Communication: Mobility status;Need for lift equipment;Other (comment) (+2 Stedy for OOB) PT Visit Diagnosis: Repeated falls (R29.6);Muscle weakness (generalized) (M62.81);History of falling (Z91.81);Difficulty in walking, not elsewhere classified (R26.2);Other symptoms and signs involving the nervous system (R29.898)     Time:  1610-9604 PT Time Calculation (min) (ACUTE ONLY): 38 min  Charges:    $Therapeutic Exercise: 8-22 mins $Therapeutic Activity: 23-37 mins PT General Charges $$ ACUTE PT VISIT: 1 Visit                     Wylan Gentzler P., PTA Acute Rehabilitation Services Secure Chat Preferred 9a-5:30pm Office: [phone]    Dorathy Kinsman [facility] 08/17/2023, 5:54 PM

## 2023-08-17 NOTE — TOC Progression Note (Signed)
 Transition of Care [facility]) - Progression Note    Patient Details  Name: Wisdom Rickey MRN: [id number] Date of Birth: May 28, 1950  Transition [facility]) CM/SW Contact  Robynne Roat A [place], LCSW Phone Number: 08/17/2023, 11:53 AM  Clinical Narrative:     CSW was contacted by Roanna Raider at [facility]. They extended bed offer to pt, the preference facility. CSW was informed by provider, pt is still confused, CSW will start insurance authorization once pt is more stable.   CSW reached out to pt's son, Yamato, to provide update on pending discharge date, confirmed acceptance of bed offer at [facility].   TOC will continue to follow.        Expected Discharge Plan and Services                                               Social Determinants of Health (SDOH) Interventions SDOH Screenings   Food Insecurity: Patient Unable To Answer (08/09/2023)  Housing: Patient Unable To Answer (08/09/2023)  Transportation Needs: Patient Unable To Answer (08/09/2023)  Utilities: Patient Unable To Answer (08/09/2023)  Tobacco Use: Medium Risk (08/09/2023)    Readmission Risk Interventions     No data to display

## 2023-08-17 NOTE — Progress Notes (Signed)
 Speech Language Pathology Treatment: Dysphagia  Patient Details Name: Anita Mcadory MRN: [id number] DOB: Jun 05, 1950 Today's Date: 08/17/2023 Time: 4098-1191 SLP Time Calculation (min) (ACUTE ONLY): 20 min  Assessment / Plan / Recommendation Clinical Impression  Patient seen by SLP for skilled treatment focused on dysphagia goals. Patient was awake, alert, continues to be confused but is calm. He asked SLP to take off the restraint mitts and doesn't demonstrate awareness or orientation to place, doesn't know why he is here in the hospital. He was receptive to having nectar thick apple juice and puree fruit. No immediate, overt s/s aspiration but patient with two instances of delayed cough which sounded much like previous date. Cough is not productive. SLP recommending to continue on current diet and will continue to follow for toleration and ability to advance.     HPI  Patient is a 73 y.o. 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 He presented to the hospital on 08/09/23 for evaluation following fall/syncope, Barrett's esophagus, GERD. SLP swallow evaluation ordered after RN observed him to get choked and turn purple after a bite of food and sip of juice. Per RD, patient reportedly did well with eating previous date.       SLP Plan  Continue with current plan of care      Recommendations for follow up therapy are one component of a multi-disciplinary discharge planning process, led by the attending physician.  Recommendations may be updated based on patient status, additional functional criteria and insurance authorization.    Recommendations  Diet recommendations: Dysphagia 1 (puree);Nectar-thick liquid Liquids provided via: Cup;Straw Medication Administration: Crushed with puree Supervision: Full supervision/cueing for compensatory strategies;Staff to assist with self feeding Compensations: Slow rate;Small sips/bites;Minimize  environmental distractions Postural Changes and/or Swallow Maneuvers: Seated upright 90 degrees                  Oral care prior to ice chip/H20;Oral care BID;Staff/trained caregiver to provide oral care   Frequent or constant Supervision/Assistance Dysphagia, oropharyngeal phase (R13.12)     Continue with current plan of care    Angela Nevin, MA, CCC-SLP Speech Therapy

## 2023-08-17 NOTE — Progress Notes (Signed)
 Physical Therapy Treatment Patient Details Name: Francisco Garcia MRN: [id number] DOB: Nov 06, 1950 Today's Date: 08/17/2023   History of Present Illness 73 year old male presented to ER 3/19 with fall, syncope, Acute respiratory failure with hypoxia, CAP (community acquired pneumonia), COPD with acute exacerbation (HCC), Acute CHF (congestive heart failure), Lactic acidosis, fall with mild rhabdomyolysis. PMHx: COPD, CAD status post CABG, HTN, PAD, CA bladder.    PT Comments  Pt calling out for assist as PTA walking by so PTA returned to room to assist him with repositioning for comfort and pressure relief. RN notified and arrived at end of session to assist with posterior supine scooting toward [facility] and agreeable to assist him with feeding; bed placed in chair posture so pt can digest better while eating. Patient will benefit from continued inpatient follow up therapy, <3 hours/day; pt appears below functional baseline at this time but difficult to assess prior level of function as no family present to confirm and pt is not reliable reporter. Pt is making good progress toward goals and participating better each session.     If plan is discharge home, recommend the following: Two people to help with walking and/or transfers;A lot of help with bathing/dressing/bathroom;Assistance with cooking/housework;Direct supervision/assist for medications management;Direct supervision/assist for financial management;Assist for transportation;Supervision due to cognitive status   Can travel by private vehicle     No  Equipment Recommendations  None recommended by PT    Recommendations for Other Services OT consult (further cognitive work-up; unclear PLOF but he appears to live alone?)     Precautions / Restrictions Precautions Precautions: Fall Recall of Precautions/Restrictions: Impaired Precaution/Restrictions Comments: monitor O2 Restrictions Weight Bearing Restrictions Per Provider Order: No      Mobility  Bed Mobility Overal bed mobility: Needs Assistance Bed Mobility: Rolling, Supine to Sit, Sit to Supine Rolling: Max assist Sidelying to sit: Max assist, +2 for safety/equipment, HOB elevated Supine to sit: Max assist, HOB elevated (to long sitting) Sit to supine: Mod assist   General bed mobility comments: +2 totalA for posterior supine scooting toward HOB prior to sitting pt up in bed chair posture so he can eat his dinner. RN present to feed him at end of session.    Transfers Overall transfer level: Needs assistance Equipment used: Ambulation equipment used Transfers: Sit to/from Stand Sit to Stand: Max assist, +2 physical assistance, From elevated surface, Via lift equipment           General transfer comment: defer, RN arriving to feed patient. Transfer via Lift Equipment: Stedy  Ambulation/Gait             Pre-gait activities: NT     Stairs             Wheelchair Mobility     Tilt Bed    Modified Rankin (Stroke Patients Only)       Balance Overall balance assessment: Needs assistance Sitting-balance support: Feet supported, Bilateral upper extremity supported Sitting balance-Leahy Scale: Poor Sitting balance - Comments: L lean; mostly minA for safety/stability Postural control: Left lateral lean Standing balance support: Bilateral upper extremity supported, Reliant on assistive device for balance Standing balance-Leahy Scale: Poor Standing balance comment: up to +2 assist for static standing in [employer] Communication Communication: Impaired Factors Affecting Communication: Difficulty expressing self;Reduced clarity of speech (poor dentition, accent and dysarthria)  Cognition Arousal: Alert  Behavior During Therapy: Flat affect   PT - Cognitive impairments: No family/caregiver present to determine baseline, Difficult to assess, Initiation, Sequencing, Problem solving,  Safety/Judgement, Attention, Awareness, Orientation Difficult to assess due to: Impaired communication Orientation impairments: Place, Time, Situation                   PT - Cognition Comments: Difficulty assessing pt orientation due to poor dentition and dysarthric speech. Pt appears oriented to self but not to situation/location. Pt given soft touch call bell to use while in bed due to use of mitts and pt not able to effectively use regular call bell. Following commands: Impaired Following commands impaired: Follows one step commands with increased time    Cueing Cueing Techniques: Verbal cues, Gestural cues, Tactile cues  Exercises Other Exercises Other Exercises: STS x 3 reps and static standing ~30 sec each rep for BLE/BUE strengthening. Rest breaks to recover between attempts. Other Exercises: Bed "crunches" x5 reps with BUE support    General Comments General comments (skin integrity, edema, etc.): No acute s/sx distress during session on RA (poor signal on portable pulse oximeter on his finger); RN notified he needs feeding assist as his dinner has arrived and pt states he wants to eat/drink. Bed rails covered by padding on L side to protect his skin as pt was impulsive earlier in the day and tending to lean toward L rail      Pertinent Vitals/Pain Pain Assessment Pain Assessment: PAINAD Breathing: normal Negative Vocalization: none Facial Expression: smiling or inexpressive Body Language: tense, distressed pacing, fidgeting Consolability: distracted or reassured by voice/touch PAINAD Score: 2 Pain Location: not localized Pain Descriptors / Indicators: Grimacing Pain Intervention(s): Limited activity within patient's tolerance, Monitored during session, Repositioned    Home Living                          Prior Function            PT Goals (current goals can now be found in the care plan section) Acute Rehab PT Goals Patient Stated Goal: none  stated PT Goal Formulation: Patient unable to participate in goal setting Time For Goal Achievement: 08/25/23 Progress towards PT goals: Progressing toward goals    Frequency    Min 2X/week      PT Plan      Co-evaluation              AM-PAC PT "6 Clicks" Mobility   Outcome Measure  Help needed turning from your back to your side while in a flat bed without using bedrails?: A Little Help needed moving from lying on your back to sitting on the side of a flat bed without using bedrails?: A Lot Help needed moving to and from a bed to a chair (including a wheelchair)?: Total Help needed standing up from a chair using your arms (e.g., wheelchair or bedside chair)?: A Lot (+2 safety) Help needed to walk in hospital room?: Total Help needed climbing 3-5 steps with a railing? : Total 6 Click Score: 10    End of Session Equipment Utilized During Treatment: Gait belt Activity Tolerance: Patient tolerated treatment well Patient left: in bed;with call bell/phone within reach;with bed alarm set;Other (comment);with nursing/sitter in room (bed in chair posture, RN present to feed him) Nurse Communication: Mobility status;Need for lift equipment;Other (comment) (+2 Stedy for OOB) PT Visit Diagnosis: Repeated falls (R29.6);Muscle weakness (generalized) (M62.81);History of falling (Z91.81);Difficulty in walking, not elsewhere classified (  R26.2);Other symptoms and signs involving the nervous system (R29.898)     Time: 2130-8657 PT Time Calculation (min) (ACUTE ONLY): 10 min  Charges:    $Therapeutic Activity: 8-22 mins PT General Charges $$ ACUTE PT VISIT: 1 Visit                     Britny Riel P., PTA Acute Rehabilitation Services Secure Chat Preferred 9a-5:30pm Office: [phone]    Dorathy Kinsman [facility] 08/17/2023, 6:03 PM

## 2023-08-17 NOTE — Progress Notes (Signed)
 Progress Note   Patient: Francisco Garcia WGN:[id number] DOB: [birth year] DOA: 08/09/2023     8 DOS: the patient was seen and examined on 08/17/2023   Brief hospital course: Patient is a 73 year old male with history of COPD, CAD status post CABG, HTN, PAD, and CA bladder.  Patient presented to ER with fall/      syncope.  Patient is a poor historian, apparently was last seen normal a day before per ER provider who spoke with the family.  His speech was noted to be abnormal as well.  In the ER, he was found to be short of breath, bilateral diffuse rhonchi.  On presentation, serum creatinine was 1.4, lactic acidosis 3.0, CK 750.  Chest x-ray revealed new heterogenous opacity overlying the medial aspect of the right upper lung zone concerning for consolidation.  CT head C-spine revealed no CT evidence of acute intracranial abnormality, no acute fracture or traumatic malalignment of C-spine.  Interlobular septal thickening which could reflect pulmonary edema, emphysema.  Left shoulder x-ray showed chronic longstanding rotator cuff tear, mild glenohumeral osteoarthritis.  MRI brain revealed no acute intracranial abnormality.  Age-related simple atrophy with mild chronic small vessel ischemic disease reported.  Right upper quadrant ultrasound revealed subtle liver surface nodularity concerning for cirrhosis, no discrete focal liver lesion.  Small amount of gallbladder sludge   08/14/2023: Patient seen.  Patient stable.  Pursue disposition.  Assessment and Plan: Acute respiratory failure with hypoxia (HCC):  -multifactorial secondary to community-acquired pneumonia, COPD with acute exacerbation, acute CHF/pulmonary edema.  Procalcitonin 0.5 -On 2 L O2 via [place], continue management of PNA, COPD, CHF 08/14/2023: Echocardiogram revealed EF of 30 to 35%.  Optimize volume.  Cardiology input is appreciated  - Robitussin 15 mL PO q6 hr  - Xopenex PRN   CAP (community acquired pneumonia; BACTERIAL) - Patient has  completed course of antibiotics  - Pulmicort/brovana bid    COPD with acute exacerbation (HCC) -Continue scheduled DuoNebs, Pulmicort/Brovana - IV Solumedrol 40 mg daily     Acute CHF (congestive heart failure) (HCC) - Coreg 6.25 mg PO bid  -Cardiology input is appreciated.     Lactic acidosis, fall with mild rhabdomyolysis - Resolved   Acute kidney injury - IV D5-1/2 NS @ 75 cc/hr    Acute metabolic encephalopathy -Likely has underlying dementia, B12, folate normal, ammonia level normal, TSH 1.3 -No FND's, MRI of the brain showed no acute intracranial abnormality.  Age-related cerebral atrophy with mild chronic small vessel ischemic disease. -On Geodon prn   Hypertensive urgency - Coreg as above  - Nifedipine 60 mg PO daily    Hyperlipidemia -Hold statins due to rhabdomyolysis   Hypernatremia: - IV fluids as above   Severe malnutrition  - Complicating care    Hypercalcemia  - Resolved  Subjective: Pt seen and examined at the bedside. IV steroids are being tapered starting today. IV fluids changed to D5-1/2 NS. Na+ nearly wnl and kidney function much improved with IV hydration.   Physical Exam: Vitals:   08/16/23 2200 08/17/23 0402 08/17/23 0731 08/17/23 1245  BP: (!) 104/90 (!) 179/101  (!) 149/68  Pulse: 73 69  79  Resp:  18  16  Temp:  (!) 97.5 F (36.4 C)  97.6 F (36.4 C)  TempSrc:    Axillary  SpO2: 92% 100% 100% 99%  Weight:      Height:       Constitutional:      Comments: Awake but not oriented  HENT:  Head: Normocephalic.     Mouth/Throat:     Mouth: Mucous membranes are dry.  Cardiovascular:     Rate and Rhythm: Normal rate.  Pulmonary:     Effort: Pulmonary effort is normal.     Breath sounds: Coarse breath sounds b/l Abdominal:     Palpations: Abdomen is soft.  Musculoskeletal:     Cervical back: Neck supple.  Skin:    General: Skin is warm.  Neurological:     Mental Status: He is disoriented.     Disposition: Status is:  Inpatient Remains inpatient appropriate because: serial labs and IV fluids   Planned Discharge Destination: Skilled nursing facility    Time spent: 35 minutes  Author: Baron Hamper , MD 08/17/2023 2:18 PM  For on call review [email or web address].

## 2023-08-18 ENCOUNTER — Other Ambulatory Visit: Payer: Self-pay

## 2023-08-18 DIAGNOSIS — J9601 Acute respiratory failure with hypoxia: Secondary | ICD-10-CM | POA: Diagnosis not present

## 2023-08-18 LAB — COMPREHENSIVE METABOLIC PANEL WITH GFR
ALT: 32 U/L (ref 0–44)
AST: 26 U/L (ref 15–41)
Albumin: 2.4 g/dL — ABNORMAL LOW (ref 3.5–5.0)
Alkaline Phosphatase: 67 U/L (ref 38–126)
Anion gap: 4 — ABNORMAL LOW (ref 5–15)
BUN: 46 mg/dL — ABNORMAL HIGH (ref 8–23)
CO2: 30 mmol/L (ref 22–32)
Calcium: 10.2 mg/dL (ref 8.9–10.3)
Chloride: 114 mmol/L — ABNORMAL HIGH (ref 98–111)
Creatinine, Ser: 1.2 mg/dL (ref 0.61–1.24)
GFR, Estimated: >60 mL/min (ref >60)
Glucose, Bld: 115 mg/dL — ABNORMAL HIGH (ref 70–99)
Potassium: 4.4 mmol/L (ref 3.5–5.1)
Sodium: 148 mmol/L — ABNORMAL HIGH (ref 135–145)
Total Bilirubin: 1.9 mg/dL — ABNORMAL HIGH (ref 0.0–1.2)
Total Protein: 5.9 g/dL — ABNORMAL LOW (ref 6.5–8.1)

## 2023-08-18 MED ORDER — LORAZEPAM 2 MG/ML IJ SOLN
0.5000 mg | Freq: Four times a day (QID) | INTRAMUSCULAR | Status: AC | PRN
  Administered 2023-08-18 – 2023-08-19 (×2): 0.5 mg via INTRAVENOUS
  Filled 2023-08-18 (×3): qty 1

## 2023-08-18 MED ORDER — LORAZEPAM 2 MG/ML IJ SOLN
1.0000 mg | Freq: Four times a day (QID) | INTRAMUSCULAR | Status: DC | PRN
Start: 2023-08-18 — End: 2023-08-18

## 2023-08-18 MED ORDER — LORAZEPAM 2 MG/ML IJ SOLN: 1.0000 mg | Freq: Four times a day (QID) | INTRAMUSCULAR | Status: DC | PRN

## 2023-08-18 MED ORDER — LORAZEPAM 2 MG/ML IJ SOLN
1.0000 mg | INTRAMUSCULAR | Status: AC
  Administered 2023-08-18: 1 mg via INTRAVENOUS
  Filled 2023-08-18: qty 1

## 2023-08-18 NOTE — Plan of Care (Signed)

## 2023-08-18 NOTE — Progress Notes (Addendum)
 Tubing of pigtail extension broken. Removed dressing and extension. New extension placed. Line flushed and blood return noted. New Biopatch, StatLock and dressing placed. Pt tolerated well. Unit RN made aware that midline is still patent and can be used.

## 2023-08-18 NOTE — Progress Notes (Signed)
 Persistent agitation and anxiety RN reported that patient is very anxious and Geodon has not been helping him much.  Patient has been pulling up his mitten off and trying to take off the IV access. Per chart review patient has been admitted for acute metabolic encephalopathy, has age-related cellular atrophy, acute hypoxic respiratory failure. - Due to persistent agitation giving Ativan 1 mg stat.  Continue Ativan 0.5 mg every 6 hour as needed for management of anxiety.  Tereasa Coop, MD [facility] 08/18/2023, 12:13 AM

## 2023-08-18 NOTE — Progress Notes (Signed)
 Progress Note   Patient: Francisco Garcia:[id number] DOB: [birth year] DOA: 08/09/2023     9 DOS: the patient was seen and examined on 08/18/2023   Brief hospital course: Patient is a 73 year old male with history of COPD, CAD status post CABG, HTN, PAD, and CA bladder.  Patient presented to ER with fall/      syncope.  Patient is a poor historian, apparently was last seen normal a day before per ER provider who spoke with the family.  His speech was noted to be abnormal as well.  In the ER, he was found to be short of breath, bilateral diffuse rhonchi.  On presentation, serum creatinine was 1.4, lactic acidosis 3.0, CK 750.  Chest x-ray revealed new heterogenous opacity overlying the medial aspect of the right upper lung zone concerning for consolidation.  CT head C-spine revealed no CT evidence of acute intracranial abnormality, no acute fracture or traumatic malalignment of C-spine.  Interlobular septal thickening which could reflect pulmonary edema, emphysema.  Left shoulder x-ray showed chronic longstanding rotator cuff tear, mild glenohumeral osteoarthritis.  MRI brain revealed no acute intracranial abnormality.  Age-related simple atrophy with mild chronic small vessel ischemic disease reported.  Right upper quadrant ultrasound revealed subtle liver surface nodularity concerning for cirrhosis, no discrete focal liver lesion.  Small amount of gallbladder sludge   08/14/2023: Patient seen.  Patient stable.  Pursue disposition.  Assessment and Plan: Acute respiratory failure with hypoxia (HCC):  -multifactorial secondary to community-acquired pneumonia, COPD with acute exacerbation, acute CHF/pulmonary edema.  Procalcitonin 0.5 -On 2 L O2 via [place], continue management of PNA, COPD, CHF 08/14/2023: Echocardiogram revealed EF of 30 to 35%.  Optimize volume.  Cardiology input is appreciated  - Robitussin 15 mL PO q6 hr  - Xopenex PRN   CAP (community acquired pneumonia; BACTERIAL) - Patient has  completed course of antibiotics  - Pulmicort/brovana bid    COPD with acute exacerbation (HCC) -Continue scheduled DuoNebs, Pulmicort/Brovana    Acute CHF (congestive heart failure) (HCC) - Coreg 6.25 mg PO bid  -Cardiology input is appreciated.     Lactic acidosis, fall with mild rhabdomyolysis - Resolved   Acute kidney injury - IV D5-1/2 NS @ 75 cc/hr    Acute metabolic encephalopathy -Likely has underlying dementia, B12, folate normal, ammonia level normal, TSH 1.3 -No FND's, MRI of the brain showed no acute intracranial abnormality.  Age-related cerebral atrophy with mild chronic small vessel ischemic disease. -On Geodon prn   Hypertensive urgency - Coreg as above  - Nifedipine 60 mg PO daily    Hyperlipidemia -Hold statins due to rhabdomyolysis   Hypernatremia: - IV fluids as above   Severe malnutrition  - Complicating care    Hypercalcemia  - Resolved  Subjective: Pt seen and examined at the bedside. Overnight the pt received ativan due to agitation. This morning he was sleeping and not able to answer questions. Cr is improving with IV fluids. IV steroids discontinued today.  Physical Exam: Vitals:   08/17/23 2030 08/18/23 0500 08/18/23 0815 08/18/23 1129  BP: 113/83  (!) 139/119 (!) 160/88  Pulse: 84  69 78  Resp: 18  17   Temp: 97.6 F (36.4 C)  (!) 97.5 F (36.4 C)   TempSrc:   Oral   SpO2: 93%  94% 96%  Weight:  57.6 kg    Height:       Constitutional:      Comments:not oriented  HENT:     Head: Normocephalic.  Mouth/Throat:     Mouth: Mucous membranes are dry.  Cardiovascular:     Rate and Rhythm: Normal rate.  Pulmonary:     Effort: Pulmonary effort is normal.     Breath sounds: CTA b/l Abdominal:     Palpations: Abdomen is soft.  Musculoskeletal:     Cervical back: Neck supple.  Skin:    General: Skin is warm.  Neurological:     Mental Status: He is disoriented.      Disposition: Status is: Inpatient Remains inpatient  appropriate because: IV fluids and improvement in mentation   Planned Discharge Destination: Skilled nursing facility    Time spent: 35 minutes  Author: Baron Hamper , MD 08/18/2023 2:05 PM  For on call review [email or web address].

## 2023-08-19 DIAGNOSIS — J9601 Acute respiratory failure with hypoxia: Secondary | ICD-10-CM | POA: Diagnosis not present

## 2023-08-19 LAB — COMPREHENSIVE METABOLIC PANEL WITH GFR
ALT: 40 U/L (ref 0–44)
AST: 35 U/L (ref 15–41)
Albumin: 2.6 g/dL — ABNORMAL LOW (ref 3.5–5.0)
Alkaline Phosphatase: 77 U/L (ref 38–126)
Anion gap: 7 (ref 5–15)
BUN: 35 mg/dL — ABNORMAL HIGH (ref 8–23)
CO2: 30 mmol/L (ref 22–32)
Calcium: 10.4 mg/dL — ABNORMAL HIGH (ref 8.9–10.3)
Chloride: 114 mmol/L — ABNORMAL HIGH (ref 98–111)
Creatinine, Ser: 1.05 mg/dL (ref 0.61–1.24)
GFR, Estimated: >60 mL/min (ref >60)
Glucose, Bld: 100 mg/dL — ABNORMAL HIGH (ref 70–99)
Potassium: 5 mmol/L (ref 3.5–5.1)
Sodium: 151 mmol/L — ABNORMAL HIGH (ref 135–145)
Total Bilirubin: 2.4 mg/dL — ABNORMAL HIGH (ref 0.0–1.2)
Total Protein: 6.3 g/dL — ABNORMAL LOW (ref 6.5–8.1)

## 2023-08-19 MED ORDER — LORAZEPAM 2 MG/ML IJ SOLN
0.5000 mg | Freq: Four times a day (QID) | INTRAMUSCULAR | Status: DC | PRN
  Administered 2023-08-19: 0.5 mg via INTRAVENOUS
  Filled 2023-08-19: qty 1

## 2023-08-19 MED ORDER — DEXTROSE 5 % IV SOLN: INTRAVENOUS | Status: DC

## 2023-08-19 NOTE — Plan of Care (Signed)
 Patient alert to self, garbled speech. Agitated and combative this evening while getting vitals taken. Patient refused and pushed away writer while trying to suction. D5 running at 191mL/hr via LUA midline. Safety precautions maintained.   Problem: Nutrition: Goal: Adequate nutrition will be maintained Outcome: Progressing   Problem: Education: Goal: Knowledge of General Education information will improve Description: Including pain rating scale, medication(s)/side effects and non-pharmacologic comfort measures Outcome: Not Progressing   Problem: Activity: Goal: Risk for activity intolerance will decrease Outcome: Not Progressing

## 2023-08-19 NOTE — Progress Notes (Signed)
 Progress Note   Patient: Francisco Garcia ZOX:[id number] DOB: 1950/11/03 DOA: 08/09/2023     10 DOS: the patient was seen and examined on 08/19/2023   Brief hospital course: Patient is a 73 year old male with history of COPD, CAD status post CABG, HTN, PAD, and CA bladder.  Patient presented to ER with fall/      syncope.  Patient is a poor historian, apparently was last seen normal a day before per ER provider who spoke with the family.  His speech was noted to be abnormal as well.  In the ER, he was found to be short of breath, bilateral diffuse rhonchi.  On presentation, serum creatinine was 1.4, lactic acidosis 3.0, CK 750.  Chest x-ray revealed new heterogenous opacity overlying the medial aspect of the right upper lung zone concerning for consolidation.  CT head C-spine revealed no CT evidence of acute intracranial abnormality, no acute fracture or traumatic malalignment of C-spine.  Interlobular septal thickening which could reflect pulmonary edema, emphysema.  Left shoulder x-ray showed chronic longstanding rotator cuff tear, mild glenohumeral osteoarthritis.  MRI brain revealed no acute intracranial abnormality.  Age-related simple atrophy with mild chronic small vessel ischemic disease reported.  Right upper quadrant ultrasound revealed subtle liver surface nodularity concerning for cirrhosis, no discrete focal liver lesion.  Small amount of gallbladder sludge   08/14/2023: Patient seen.  Patient stable.  Pursue disposition.  Assessment and Plan: Acute respiratory failure with hypoxia (HCC):  -multifactorial secondary to community-acquired pneumonia, COPD with acute exacerbation, acute CHF/pulmonary edema.  Procalcitonin 0.5 -On 2 L O2 via [place], continue management of PNA, COPD, CHF 08/14/2023: Echocardiogram revealed EF of 30 to 35%.  Optimize volume.  Cardiology input is appreciated  - Robitussin 15 mL PO q6 hr  - Xopenex PRN   CAP (community acquired pneumonia; BACTERIAL) - Patient has  completed course of antibiotics  - Pulmicort/brovana bid    COPD with acute exacerbation (HCC) -Continue scheduled DuoNebs, Pulmicort/Brovana    Acute CHF (congestive heart failure) (HCC) - Coreg 6.25 mg PO bid  -Cardiology input is appreciated.     Lactic acidosis, fall with mild rhabdomyolysis - Resolved   Acute kidney injury - Resolved  - IV D5W 100 cc/hr   Acute metabolic encephalopathy -Likely has underlying dementia, B12, folate normal, ammonia level normal, TSH 1.3 -No FND's, MRI of the brain showed no acute intracranial abnormality.  Age-related cerebral atrophy with mild chronic small vessel ischemic disease. -On Geodon prn   Hypertensive urgency - Coreg as above  - Nifedipine 60 mg PO daily    Hyperlipidemia -Hold statins due to rhabdomyolysis   Hypernatremia: - IV fluids as above   Severe malnutrition  - Complicating care    Hypercalcemia  - Resolved  Subjective: Pt seen and examined at the bedside.IV fluids changed back to D5W due to ongoing hyperNa+. Mentation is much more awake today vs yesterday.  He was eating breakfast with assistance this morning.  Physical Exam: Vitals:   08/19/23 0500 08/19/23 0521 08/19/23 0735 08/19/23 0900  BP:  (!) 128/99 (!) 123/98   Pulse:  86 64   Resp:  16 16   Temp:  97.9 F (36.6 C) 97.9 F (36.6 C)   TempSrc:  Axillary Axillary   SpO2:  96% 96% 95%  Weight: 57.1 kg     Height:        Constitutional:      Comments:Awake  HENT:     Head: Normocephalic.     Mouth/Throat:  Mouth: Mucous membranes are dry.  Cardiovascular:     Rate and Rhythm: Normal rate.  Pulmonary:     Effort: Pulmonary effort is normal.     Breath sounds: CTA b/l Abdominal:     Palpations: Abdomen is soft.  Musculoskeletal:     Cervical back: Neck supple.  Skin:    General: Skin is warm.  Neurological:     Mental Status: He is alert      Disposition: Status is: Inpatient Remains inpatient appropriate because: IV fluids and  serial labs   Planned Discharge Destination: Skilled nursing facility    Time spent: 35 minutes  Author: Baron Hamper , MD 08/19/2023 12:21 PM  For on call review [email or web address].

## 2023-08-20 DIAGNOSIS — J9601 Acute respiratory failure with hypoxia: Secondary | ICD-10-CM | POA: Diagnosis not present

## 2023-08-20 MED ORDER — DEXTROSE 5 % IV SOLN: INTRAVENOUS | Status: AC

## 2023-08-20 NOTE — Progress Notes (Signed)
 Unable to obtain blood return from midline. LUE repositioning unsuccessful. Notified primary nurse and unit phlebotomist, Yaz to obtain lab draw.

## 2023-08-20 NOTE — Progress Notes (Signed)
 Progress Note   Patient: Francisco Garcia ZOX:[id number] DOB: [birth year] DOA: 08/09/2023     11 DOS: the patient was seen and examined on 08/20/2023   Brief hospital course: Patient is a 73 year old male with history of COPD, CAD status post CABG, HTN, PAD, and CA bladder.  Patient presented to ER with fall/      syncope.  Patient is a poor historian, apparently was last seen normal a day before per ER provider who spoke with the family.  His speech was noted to be abnormal as well.  In the ER, he was found to be short of breath, bilateral diffuse rhonchi.  On presentation, serum creatinine was 1.4, lactic acidosis 3.0, CK 750.  Chest x-ray revealed new heterogenous opacity overlying the medial aspect of the right upper lung zone concerning for consolidation.  CT head C-spine revealed no CT evidence of acute intracranial abnormality, no acute fracture or traumatic malalignment of C-spine.  Interlobular septal thickening which could reflect pulmonary edema, emphysema.  Left shoulder x-ray showed chronic longstanding rotator cuff tear, mild glenohumeral osteoarthritis.  MRI brain revealed no acute intracranial abnormality.  Age-related simple atrophy with mild chronic small vessel ischemic disease reported.  Right upper quadrant ultrasound revealed subtle liver surface nodularity concerning for cirrhosis, no discrete focal liver lesion.  Small amount of gallbladder sludge   08/14/2023: Patient seen.  Patient stable.  Pursue disposition.  Assessment and Plan: Acute respiratory failure with hypoxia (HCC):  -multifactorial secondary to community-acquired pneumonia, COPD with acute exacerbation, acute CHF/pulmonary edema.  Procalcitonin 0.5 -On 2 L O2 via [place], continue management of PNA, COPD, CHF 08/14/2023: Echocardiogram revealed EF of 30 to 35%.  Optimize volume.  Cardiology input is appreciated  - Robitussin 15 mL PO q6 hr  - Xopenex PRN   CAP (community acquired pneumonia; BACTERIAL) - Patient has  completed course of antibiotics  - Pulmicort/brovana bid    COPD with acute exacerbation (HCC) -Continue scheduled DuoNebs, Pulmicort/Brovana    Acute CHF (congestive heart failure) (HCC) - Coreg 6.25 mg PO bid  -Cardiology input is appreciated.     Lactic acidosis, fall with mild rhabdomyolysis - Resolved   Acute kidney injury - Resolved    Acute metabolic encephalopathy -Likely has underlying dementia, B12, folate normal, ammonia level normal, TSH 1.3 -No FND's, MRI of the brain showed no acute intracranial abnormality.  Age-related cerebral atrophy with mild chronic small vessel ischemic disease. -On Geodon prn   Hypertensive urgency - Coreg as above  - Nifedipine 60 mg PO daily    Hyperlipidemia -Hold statins due to rhabdomyolysis   Hypernatremia: - IV fluids as above   Severe malnutrition  - Complicating care    Hypercalcemia  - Resolved  Subjective: Pt seen and examined at the bedside. Pt was laying in bed naked this morning.  He remains confused which is holding up his discharge from the hospital. Phlebotomy unable to collect the morning labs today.  Physical Exam: Vitals:   08/19/23 2021 08/19/23 2133 08/20/23 0544 08/20/23 0827  BP: (!) 121/94  119/65 (!) 139/103  Pulse: 77  66 67  Resp: 17  19 18   Temp: 97.7 F (36.5 C)  97.7 F (36.5 C) 98.1 F (36.7 C)  TempSrc: Axillary  Axillary Oral  SpO2: 95% 96% 95% 96%  Weight:      Height:       Constitutional:      Comments:Awake  HENT:     Head: Normocephalic.     Mouth/Throat:  Mouth: Mucous membranes are dry.  Cardiovascular:     Rate and Rhythm: Normal rate.  Pulmonary:     Effort: Pulmonary effort is normal.     Breath sounds: CTA b/l Abdominal:     Palpations: Abdomen is soft.  Musculoskeletal:     Cervical back: Neck supple.  Skin:    General: Skin is warm.  Neurological:     Mental Status: He is alert      Disposition: Status is: Inpatient Remains inpatient appropriate  because: serial labs   Planned Discharge Destination: Skilled nursing facility    Time spent: 35 minutes  Author: Baron Hamper , MD 08/20/2023 11:40 AM  For on call review [email or web address].

## 2023-08-21 ENCOUNTER — Inpatient Hospital Stay (HOSPITAL_COMMUNITY)

## 2023-08-21 DIAGNOSIS — J9601 Acute respiratory failure with hypoxia: Secondary | ICD-10-CM | POA: Diagnosis not present

## 2023-08-21 LAB — CBC
HCT: 54 % — ABNORMAL HIGH (ref 39.0–52.0)
Hemoglobin: 18.4 g/dL — ABNORMAL HIGH (ref 13.0–17.0)
MCH: 36.2 pg — ABNORMAL HIGH (ref 26.0–34.0)
MCHC: 34.1 g/dL (ref 30.0–36.0)
MCV: 106.3 fL — ABNORMAL HIGH (ref 80.0–100.0)
Platelets: 136 10*3/uL — ABNORMAL LOW (ref 150–400)
RBC: 5.08 MIL/uL (ref 4.22–5.81)
RDW: 12.5 % (ref 11.5–15.5)
WBC: 13.3 10*3/uL — ABNORMAL HIGH (ref 4.0–10.5)
nRBC: 0 % (ref 0.0–0.2)

## 2023-08-21 LAB — COMPREHENSIVE METABOLIC PANEL WITH GFR
ALT: 42 U/L (ref 0–44)
AST: 39 U/L (ref 15–41)
Albumin: 2.5 g/dL — ABNORMAL LOW (ref 3.5–5.0)
Alkaline Phosphatase: 80 U/L (ref 38–126)
Anion gap: 6 (ref 5–15)
BUN: 40 mg/dL — ABNORMAL HIGH (ref 8–23)
CO2: 30 mmol/L (ref 22–32)
Calcium: 10.3 mg/dL (ref 8.9–10.3)
Chloride: 111 mmol/L (ref 98–111)
Creatinine, Ser: 1.36 mg/dL — ABNORMAL HIGH (ref 0.61–1.24)
GFR, Estimated: 55 mL/min — ABNORMAL LOW (ref >60)
Glucose, Bld: 95 mg/dL (ref 70–99)
Potassium: 3.7 mmol/L (ref 3.5–5.1)
Sodium: 147 mmol/L — ABNORMAL HIGH (ref 135–145)
Total Bilirubin: 3.1 mg/dL — ABNORMAL HIGH (ref 0.0–1.2)
Total Protein: 6 g/dL — ABNORMAL LOW (ref 6.5–8.1)

## 2023-08-21 LAB — C-REACTIVE PROTEIN: CRP: 0.9 mg/dL (ref <1.0)

## 2023-08-21 LAB — GLUCOSE, CAPILLARY: Glucose-Capillary: 142 mg/dL — ABNORMAL HIGH (ref 70–99)

## 2023-08-21 NOTE — Progress Notes (Signed)
 Speech Language Pathology Treatment: Dysphagia  Patient Details Name: Francisco Garcia MRN: [id number] DOB: 08/08/1950 Today's Date: 08/21/2023 Time: 7829-5621 SLP Time Calculation (min) (ACUTE ONLY): 9 min  Assessment / Plan / Recommendation Clinical Impression  Pt in bed and appeared to have eaten breakfast- no oral residue noted. After repositioning upright he was noted to cough. He was awake but drowsy with mostly unintelligible speech and unable to state place given choice of 2. Trialed upgrade of simulated Dys 2 ( pieces graham cracker in applesauce) resulting in prolonged mastication and mild residue on left side of oral cavity that he removed with cues and liquid wash. There were no signs of aspiration with solid or nectar thick juice via straw. He is a dependent feeder (question visual ability, right eye closed and left eye open intermittently with decreased focus possibly due to drowsiness). Recommend he continue on puree, nectar thick with full assist/supervision with meals. ST will continue to follow while in acute care and recommend continue ST at next venue for ability to upgrade texture and return for outpatient MBS as appropriate.    HPI HPI: Patient is a 73 y.o. 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 He presented to the hospital on 08/09/23 for evaluation following fall/syncope, Barrett's esophagus, GERD. SLP swallow evaluation ordered after RN observed him to get choked and turn purple after a bite of food and sip of juice. Per RD, patient reportedly did well with eating previous date.      SLP Plan  Continue with current plan of care      Recommendations for follow up therapy are one component of a multi-disciplinary discharge planning process, led by the attending physician.  Recommendations may be updated based on patient status, additional functional criteria and insurance authorization.    Recommendations  Diet  recommendations: Dysphagia 1 (puree);Nectar-thick liquid Liquids provided via: Cup;Straw Medication Administration: Crushed with puree Supervision: Full supervision/cueing for compensatory strategies;Staff to assist with self feeding Compensations: Slow rate;Small sips/bites;Minimize environmental distractions Postural Changes and/or Swallow Maneuvers: Seated upright 90 degrees                  Oral care prior to ice chip/H20;Oral care BID;Staff/trained caregiver to provide oral care   Frequent or constant Supervision/Assistance Dysphagia, oropharyngeal phase (R13.12)     Continue with current plan of care     Francisco Garcia  08/21/2023, 9:18 AM

## 2023-08-21 NOTE — Progress Notes (Signed)
 Physical Therapy Treatment Patient Details Name: Francisco Garcia MRN: [id number] DOB: [birth year] Today's Date: 08/21/2023   History of Present Illness 73 year old male presented to ER 3/19 with fall, syncope, Acute respiratory failure with hypoxia, CAP (community acquired pneumonia), COPD with acute exacerbation (HCC), Acute CHF (congestive heart failure), Lactic acidosis, fall with mild rhabdomyolysis. PMHx: COPD, CAD status post CABG, HTN, PAD, CA bladder.    PT Comments  Pt received lying perpendicular in bed, moaning and restless, pt with legs over bed rails, bed alarm sounding, NT and RN not available at time of session. PTA arrived to room to assist for pt safety with repositioning for safety and pressure relief/comfort. PTA attempting to encourage pt to participate in bed mobility and to awaken him more fully however pt maintains eyes mostly closed and unable to follow simple commands >10% of the time. Pt needing up to +2 totalA to rotate trunk/legs and posterior supine scoot toward HOB. MaxA to roll to R for placement of pillow behind hip/back for pressure relief. Patient will benefit from continued inpatient follow up therapy, <3 hours/day    If plan is discharge home, recommend the following: Two people to help with walking and/or transfers;A lot of help with bathing/dressing/bathroom;Assistance with cooking/housework;Direct supervision/assist for medications management;Direct supervision/assist for financial management;Assist for transportation;Supervision due to cognitive status   Can travel by private vehicle     No  Equipment Recommendations  None recommended by PT;Other (comment) (currently at wheelchair/hoyer/hospital bed level, TBD pending progress)    Recommendations for Other Services OT consult (further cognitive work-up; unclear PLOF but he appears to live alone?)     Precautions / Restrictions Precautions Precautions: Fall Recall of Precautions/Restrictions:  Impaired Precaution/Restrictions Comments: monitor O2; mitts Restrictions Weight Bearing Restrictions Per Provider Order: No     Mobility  Bed Mobility Overal bed mobility: Needs Assistance Bed Mobility: Rolling, Supine to Sit Rolling: Max assist, +2 for safety/equipment, Used rails   Supine to sit: HOB elevated, Total assist, +2 for physical assistance, Used rails (to long sitting only; pt too lethargic to sit EOB today and resisting most movements while repositioned for comfort) Sit to supine: Total assist, +2 for safety/equipment   General bed mobility comments: Rolling to L/R sides with maxA and hand over hand assist to reach for bed rails, an improvement from AM session but poor attention to task and totalA for attempts at long sitting from bed chair posture.    Transfers Overall transfer level: Needs assistance                 General transfer comment: defer, pt too lethargic in PM to follow commands    Ambulation/Gait                   Stairs             Wheelchair Mobility     Tilt Bed    Modified Rankin (Stroke Patients Only)       Balance Overall balance assessment: Needs assistance Sitting-balance support: Feet unsupported, No upper extremity supported Sitting balance-Leahy Scale: Zero Sitting balance - Comments: posterior bias with long sitting attempts in PM Postural control: Right lateral lean     Standing balance comment: unable to assess today due to lethargy                            Communication Communication Communication: Impaired Factors Affecting Communication: Other (comment) (poor dentition, lethargy)  Cognition Arousal:  Lethargic Behavior During Therapy: Restless, Impulsive   PT - Cognitive impairments: No family/caregiver present to determine baseline, Difficult to assess, Initiation, Sequencing, Problem solving, Safety/Judgement, Attention, Awareness, Orientation, Memory Difficult to assess due to:  Impaired communication, Level of arousal Orientation impairments: Place, Time, Situation, Person                   PT - Cognition Comments: Difficulty assessing pt orientation due to confusion, pt with decreased command following despite max efforts to encourage wakefulness, but more restless this afternoon and attempting to open eyes but not appearing to focus; pt mostly keeps eyes closed and moaning. Pt lying perpendicular in bed (cushions previously placed over bed side rails) when PTA arrived and unaware of situation/location/not able to state DOB. Following commands: Impaired Following commands impaired: Follows one step commands inconsistently    Cueing Cueing Techniques: Verbal cues, Gestural cues, Tactile cues  Exercises Other Exercises Other Exercises: pt not following instructions, PROM for all BLE motions    General Comments General comments (skin integrity, edema, etc.): pillows to float heels and offload L lower back/bottom; would benefit from air bed, RN notified      Pertinent Vitals/Pain Pain Assessment Pain Assessment: PAINAD Breathing: occasional labored breathing, short period of hyperventilation Negative Vocalization: repeated troubled calling out, loud moaning/groaning, crying Facial Expression: facial grimacing Body Language: tense, distressed pacing, fidgeting Consolability: distracted or reassured by voice/touch PAINAD Score: 7 Pain Location: not localized (grimaces more when rolling and repositioning but may be due to AMS/confusion). Pain Descriptors / Indicators: Grimacing, Guarding, Moaning Pain Intervention(s): Limited activity within patient's tolerance, Monitored during session, Repositioned, Other (comment) (pillow behind L lower back/hip for comfort after repositioning to supine)    Home Living                          Prior Function            PT Goals (current goals can now be found in the care plan section) Acute Rehab PT  Goals Patient Stated Goal: none stated PT Goal Formulation: Patient unable to participate in goal setting Time For Goal Achievement: 08/25/23 Progress towards PT goals: Not progressing toward goals - comment    Frequency    Min 2X/week      PT Plan      Co-evaluation              AM-PAC PT "6 Clicks" Mobility   Outcome Measure  Help needed turning from your back to your side while in a flat bed without using bedrails?: A Lot Help needed moving from lying on your back to sitting on the side of a flat bed without using bedrails?: Total Help needed moving to and from a bed to a chair (including a wheelchair)?: Total Help needed standing up from a chair using your arms (e.g., wheelchair or bedside chair)?: Total Help needed to walk in hospital room?: Total Help needed climbing 3-5 steps with a railing? : Total 6 Click Score: 7    End of Session Equipment Utilized During Treatment: Other (comment) (bed pads) Activity Tolerance: Patient limited by lethargy;Other (comment) (cognitive deficit) Patient left: in bed;with call bell/phone within reach;with bed alarm set;Other (comment) (HOB >30 degrees; pillow under L lower back/bottom to offload pressure, blinds opened, heels floated, mitts donned) Nurse Communication: Mobility status;Need for lift equipment;Other (comment) (+2 [place] for OOB if more alert, probably only safe with constant supervision at this time if OOB; today  would need hoyer OOB; needs air bed due to immobility unless rolled Q2H) PT Visit Diagnosis: Repeated falls (R29.6);Muscle weakness (generalized) (M62.81);History of falling (Z91.81);Difficulty in walking, not elsewhere classified (R26.2);Other symptoms and signs involving the nervous system (R29.898)     Time: 9562-1308 PT Time Calculation (min) (ACUTE ONLY): 9 min  Charges:    $Therapeutic Activity: 8-22 mins PT General Charges $$ ACUTE PT VISIT: 1 Visit                     Nazareth Norenberg P., PTA Acute  Rehabilitation Services Secure Chat Preferred 9a-5:30pm Office: [phone]    Dorathy Kinsman [facility] 08/21/2023, 4:16 PM

## 2023-08-21 NOTE — Progress Notes (Signed)
 Progress Note   Patient: Travor Royce WUJ:[id number] DOB: 11/17/1950 DOA: 08/09/2023     12 DOS: the patient was seen and examined on 08/21/2023   Brief hospital course: Patient is a 73 year old male with history of COPD, CAD status post CABG, HTN, PAD, and CA bladder.  Patient presented to ER with fall/      syncope.  Patient is a poor historian, apparently was last seen normal a day before per ER provider who spoke with the family.  His speech was noted to be abnormal as well.  In the ER, he was found to be short of breath, bilateral diffuse rhonchi.  On presentation, serum creatinine was 1.4, lactic acidosis 3.0, CK 750.  Chest x-ray revealed new heterogenous opacity overlying the medial aspect of the right upper lung zone concerning for consolidation.  CT head C-spine revealed no CT evidence of acute intracranial abnormality, no acute fracture or traumatic malalignment of C-spine.  Interlobular septal thickening which could reflect pulmonary edema, emphysema.  Left shoulder x-ray showed chronic longstanding rotator cuff tear, mild glenohumeral osteoarthritis.  MRI brain revealed no acute intracranial abnormality.  Age-related simple atrophy with mild chronic small vessel ischemic disease reported.  Right upper quadrant ultrasound revealed subtle liver surface nodularity concerning for cirrhosis, no discrete focal liver lesion.  Small amount of gallbladder sludge   Assessment and Plan: Acute respiratory failure with hypoxia (HCC):  -multifactorial secondary to community-acquired pneumonia, COPD with acute exacerbation, acute CHF/pulmonary edema.  Procalcitonin 0.5 -On 2 L O2 via [place], continue management of PNA, COPD, CHF 08/14/2023: Echocardiogram revealed EF of 30 to 35%.  Optimize volume.  Cardiology input is appreciated  - Robitussin 15 mL PO q6 hr  - Xopenex PRN   CAP (community acquired pneumonia; BACTERIAL) - Patient has completed course of antibiotics  - Pulmicort/brovana bid    COPD  with acute exacerbation (HCC) -Continue scheduled DuoNebs, Pulmicort/Brovana    Acute CHF (congestive heart failure) (HCC) - Coreg 6.25 mg PO bid  - Cardiology input is appreciated.     Lactic acidosis, fall with mild rhabdomyolysis - Resolved   Acute kidney injury - Resolved    Acute metabolic encephalopathy -Likely has underlying dementia, B12, folate normal, ammonia level normal, TSH 1.3 -No FND's, MRI of the brain showed no acute intracranial abnormality.  Age-related cerebral atrophy with mild chronic small vessel ischemic disease. -On Geodon prn   Hypertensive urgency - Coreg as above  - Nifedipine 60 mg PO daily    Hyperlipidemia -Hold statins due to rhabdomyolysis   Hypernatremia: - IV D5W 75 cc/hr    Severe malnutrition  - Complicating care    Hypercalcemia  - Resolved  Subjective: Pt seen and examined at the bedside. He has responded well to the D5W and Na+ nearly wnl. Kidney function also improved from where it was last week.  This morning he was eating breakfast with assistance from staff. Barriers to discharge are the pt's waxing and waning mentation.  Physical Exam: Vitals:   08/20/23 2005 08/21/23 0455 08/21/23 0748 08/21/23 0918  BP: 102/64 116/84 133/82   Pulse: 86 83 81 79  Resp: 18 19 16 16   Temp: 98.6 F (37 C) 98 F (36.7 C)    TempSrc: Oral Axillary    SpO2: 99% 98% 94% 95%  Weight:      Height:       Constitutional:      Comments:Awake  HENT:     Head: Normocephalic.     Mouth/Throat:  Cardiovascular:  Rate and Rhythm: Normal rate.  Pulmonary:     Effort: Pulmonary effort is normal.     Breath sounds: CTA b/l Abdominal:     Palpations: Abdomen is soft.  Musculoskeletal:     Cervical back: Neck supple.  Skin:    General: Skin is warm.  Neurological:     Mental Status: He is alert      Disposition: Status is: Inpatient Remains inpatient appropriate because: IVF and serial labs   Planned Discharge Destination: Skilled  nursing facility    Time spent: 35 minutes  Author: Baron Hamper , MD 08/21/2023 12:26 PM  For on call review [email or web address].

## 2023-08-21 NOTE — Progress Notes (Signed)
 Physical Therapy Treatment Patient Details Name: Francisco Garcia MRN: [id number] DOB: 1950-09-20 Today's Date: 08/21/2023   History of Present Illness 73 year old male presented to ER 3/19 with fall, syncope, Acute respiratory failure with hypoxia, CAP (community acquired pneumonia), COPD with acute exacerbation (HCC), Acute CHF (congestive heart failure), Lactic acidosis, fall with mild rhabdomyolysis. PMHx: COPD, CAD status post CABG, HTN, PAD, CA bladder.    PT Comments  Pt received in supine, bed and gown appearing damp, pt confused with mitts donned and lethargic. With max visual/tactile cues, pt opens eyes briefly but mostly maintains eyes closed and with poor command following this date. Pt needing increased assist, up to TotalA +2 for rolling to L/R sides for bed pad removal/replacement, posterior hygiene assist/gown change and long sitting in bed while pillows/linens adjusted. Pt too lethargic to take sips of drink during session. Patient will benefit from continued inpatient follow up therapy, <3 hours/day, he remains below prior level of function which appears to have been supervision or modI level prior to admission.     If plan is discharge home, recommend the following: Two people to help with walking and/or transfers;A lot of help with bathing/dressing/bathroom;Assistance with cooking/housework;Direct supervision/assist for medications management;Direct supervision/assist for financial management;Assist for transportation;Supervision due to cognitive status   Can travel by private vehicle     No  Equipment Recommendations  None recommended by PT    Recommendations for Other Services OT consult (further cognitive work-up; unclear PLOF but he appears to live alone?)     Precautions / Restrictions Precautions Precautions: Fall Recall of Precautions/Restrictions: Impaired Precaution/Restrictions Comments: monitor O2; mitts Restrictions Weight Bearing Restrictions Per Provider  Order: No     Mobility  Bed Mobility Overal bed mobility: Needs Assistance Bed Mobility: Rolling, Supine to Sit Rolling: Max assist, Total assist, +2 for safety/equipment, Used rails   Supine to sit: HOB elevated, Total assist, +2 for physical assistance, Used rails (to long sitting only; pt too lethargic to sit EOB today and resisting most movements while repositioned for comfort) Sit to supine: Total assist, +2 for safety/equipment   General bed mobility comments: +2 totalA for posterior supine scooting toward HOB prior to sitting pt up in bed chair posture and L/R rolling for removal of damp bed pad/placement of new pad and gown. Pt maintains eyes closed throughout, only intermittently grasping rail with cues and hand over hand assist to reach for opposite rail.    Transfers Overall transfer level: Needs assistance                 General transfer comment: defer, pt too lethargic today to follow commands    Ambulation/Gait                   Stairs             Wheelchair Mobility     Tilt Bed    Modified Rankin (Stroke Patients Only)       Balance Overall balance assessment: Needs assistance Sitting-balance support: Feet supported, Bilateral upper extremity supported Sitting balance-Leahy Scale: Poor Sitting balance - Comments: R lean in bed chair posture, pillow propped under his R elbow to promote upright posture Postural control: Right lateral lean     Standing balance comment: unable to assess today due to lethargy                            Communication Communication Communication: Impaired Factors Affecting Communication: Other (comment) (  poor dentition, lethargy)  Cognition Arousal: Lethargic Behavior During Therapy: Flat affect   PT - Cognitive impairments: No family/caregiver present to determine baseline, Difficult to assess, Initiation, Sequencing, Problem solving, Safety/Judgement, Attention, Awareness, Orientation,  Memory Difficult to assess due to: Impaired communication Orientation impairments: Place, Time, Situation, Person                   PT - Cognition Comments: Difficulty assessing pt orientation due to lethargy this date, pt with decreased command following despite max efforts to encourage wakefulness and blinds opened fully. Pt given soft touch call bell to use while in bed due to use of mitts and pt not able to effectively use regular call bell, but will need reinforcement as he has too lethargic to awaken enough to instruct on use today. Following commands: Impaired Following commands impaired: Follows one step commands inconsistently    Cueing Cueing Techniques: Verbal cues, Gestural cues, Tactile cues  Exercises Other Exercises Other Exercises: supine BLE AA/PROM: knee flexion/extension, hip ab/adduction x5 reps each attempting to encourage participation (mostly PROM due to confusion/lethargy)    General Comments General comments (skin integrity, edema, etc.): bed pad changed, gown changed and pt provided with peri-care (totalA) due slight smear on bed pad. Pt offered nectar thick liquid as incentive to encourage wakefulness but pt not able to maintain eyes open so defer offering sips at this time and pt shakes head no today. TotalA to cleanse area around eyes/around mouth where old particles of food present      Pertinent Vitals/Pain Pain Assessment Pain Assessment: Faces Breathing: normal Negative Vocalization: occasional moan/groan, low speech, negative/disapproving quality Facial Expression: sad, frightened, frown Body Language: tense, distressed pacing, fidgeting Consolability: distracted or reassured by voice/touch PAINAD Score: 4 Pain Location: not localized (grimaces more when rolling and reaching for bottom during peri-care, unsure if due to confusion or pain there) Pain Descriptors / Indicators: Grimacing, Guarding Pain Intervention(s): Limited activity within  patient's tolerance, Monitored during session, Repositioned    Home Living                          Prior Function            PT Goals (current goals can now be found in the care plan section) Acute Rehab PT Goals Patient Stated Goal: none stated PT Goal Formulation: Patient unable to participate in goal setting Time For Goal Achievement: 08/25/23 Progress towards PT goals: Not progressing toward goals - comment    Frequency    Min 2X/week      PT Plan      Co-evaluation              AM-PAC PT "6 Clicks" Mobility   Outcome Measure  Help needed turning from your back to your side while in a flat bed without using bedrails?: A Lot Help needed moving from lying on your back to sitting on the side of a flat bed without using bedrails?: Total Help needed moving to and from a bed to a chair (including a wheelchair)?: Total Help needed standing up from a chair using your arms (e.g., wheelchair or bedside chair)?: Total Help needed to walk in hospital room?: Total Help needed climbing 3-5 steps with a railing? : Total 6 Click Score: 7    End of Session   Activity Tolerance: Patient limited by lethargy;Other (comment) (cognitive deficit) Patient left: in bed;with call bell/phone within reach;with bed alarm set;Other (comment);with nursing/sitter in  room (bed in chair posture, pillow folded under R elbow to encourage neutral/upright posture, blinds opened, heels floated, mitts donned) Nurse Communication: Mobility status;Need for lift equipment;Other (comment) (+2 Stedy for OOB if more alert, probably only safe with constant supervision at this time if OOB; today would need hoyer OOB) PT Visit Diagnosis: Repeated falls (R29.6);Muscle weakness (generalized) (M62.81);History of falling (Z91.81);Difficulty in walking, not elsewhere classified (R26.2);Other symptoms and signs involving the nervous system (R29.898)     Time: 1610-9604 PT Time Calculation (min) (ACUTE  ONLY): 26 min  Charges:    $Therapeutic Activity: 23-37 mins PT General Charges $$ ACUTE PT VISIT: 1 Visit                     Iyania Denne P., PTA Acute Rehabilitation Services Secure Chat Preferred 9a-5:30pm Office: [phone]    Dorathy Kinsman [facility] 08/21/2023, 3:13 PM

## 2023-08-22 DIAGNOSIS — J9601 Acute respiratory failure with hypoxia: Secondary | ICD-10-CM | POA: Diagnosis not present

## 2023-08-22 LAB — COMPREHENSIVE METABOLIC PANEL WITH GFR
ALT: 64 U/L — ABNORMAL HIGH (ref 0–44)
AST: 152 U/L — ABNORMAL HIGH (ref 15–41)
Albumin: 2.2 g/dL — ABNORMAL LOW (ref 3.5–5.0)
Alkaline Phosphatase: 83 U/L (ref 38–126)
Anion gap: 13 (ref 5–15)
BUN: 45 mg/dL — ABNORMAL HIGH (ref 8–23)
CO2: 19 mmol/L — ABNORMAL LOW (ref 22–32)
Calcium: 10 mg/dL (ref 8.9–10.3)
Chloride: 114 mmol/L — ABNORMAL HIGH (ref 98–111)
Creatinine, Ser: 1.51 mg/dL — ABNORMAL HIGH (ref 0.61–1.24)
GFR, Estimated: 48 mL/min — ABNORMAL LOW (ref >60)
Glucose, Bld: 171 mg/dL — ABNORMAL HIGH (ref 70–99)
Potassium: 4.4 mmol/L (ref 3.5–5.1)
Sodium: 146 mmol/L — ABNORMAL HIGH (ref 135–145)
Total Bilirubin: 3.2 mg/dL — ABNORMAL HIGH (ref 0.0–1.2)
Total Protein: 5.7 g/dL — ABNORMAL LOW (ref 6.5–8.1)

## 2023-08-22 LAB — GLUCOSE, CAPILLARY
Glucose-Capillary: 147 mg/dL — ABNORMAL HIGH (ref 70–99)
Glucose-Capillary: 117 mg/dL — ABNORMAL HIGH (ref 70–99)
Glucose-Capillary: 120 mg/dL — ABNORMAL HIGH (ref 70–99)

## 2023-08-22 MED ORDER — DEXTROSE-SODIUM CHLORIDE 5-0.45 % IV SOLN: INTRAVENOUS | Status: DC

## 2023-08-22 NOTE — TOC Progression Note (Signed)
 Transition of Care [facility]) - Progression Note    Patient Details  Name: Francisco Garcia MRN: [id number] Date of Birth: 07/19/1950  [facility]) CM/SW Contact  Bryne Lindon A [place], LCSW Phone Number: 08/22/2023, 10:16 AM  Clinical Narrative:     CSW spoke with pt's son, Arlo on pt's progress. He stated concerns about pt's progress and wanted CSW to reach out to [facility] and see if they'd consider offering bed to pt. Discuss options for long term plans in the future.    TOC will continue to follow.        Expected Discharge Plan and Services                                               Social Determinants of Health (SDOH) Interventions SDOH Screenings   Food Insecurity: Patient Unable To Answer (08/09/2023)  Housing: Patient Unable To Answer (08/09/2023)  Transportation Needs: Patient Unable To Answer (08/09/2023)  Utilities: Patient Unable To Answer (08/09/2023)  Tobacco Use: Medium Risk (08/09/2023)    Readmission Risk Interventions     No data to display

## 2023-08-22 NOTE — Plan of Care (Signed)
   Problem: Clinical Measurements: Goal: Ability to maintain clinical measurements within normal limits will improve Outcome: Progressing Goal: Will remain free from infection Outcome: Progressing

## 2023-08-22 NOTE — TOC Progression Note (Signed)
 Transition of Care [facility]) - Progression Note    Patient Details  Name: Francisco Garcia MRN: [id number] Date of Birth: 1951-04-11  Transition of Care [facility]) CM/SW Contact  Cobi Delph A [place], LCSW Phone Number: 08/22/2023, 4:23 PM  Clinical Narrative:     CSW started insurance authorization. Status pending.   Unable to access Home and community care transitions portal.   CSW contacted [facility] and faxed clinicals to [phone].   Reference ID: [id number].    TOC will continue to follow.       Expected Discharge Plan and Services                                               Social Determinants of Health (SDOH) Interventions SDOH Screenings   Food Insecurity: Patient Unable To Answer (08/09/2023)  Housing: Patient Unable To Answer (08/09/2023)  Transportation Needs: Patient Unable To Answer (08/09/2023)  Utilities: Patient Unable To Answer (08/09/2023)  Tobacco Use: Medium Risk (08/09/2023)    Readmission Risk Interventions     No data to display

## 2023-08-22 NOTE — Progress Notes (Signed)
 PROGRESS NOTE  Francisco Garcia  DOB: 29-Jun-1950  PCP: [name], MD ZOX:[id number]  DOA: 08/09/2023  LOS: 13 days  Hospital Day: 14  Brief narrative: Francisco Garcia is a 73 y.o. male with PMH significant for HTN, CAD/CABG, paroxysmal A-fib, TIA, carotid artery stenosis, PAD, COPD, memory impairment bladder cancer. 3/19, patient presented to the ED with a fall, altered mental status  No in the ED, he was short of breath, had diffuse bilateral rhonchi Workup showed lactic acidosis of 3, CK level elevated 750 Chest x-ray revealed new heterogenous opacity overlying the medial aspect of the right upper lung zone concerning for consolidation.  CT head C-spine revealed no CT evidence of acute intracranial abnormality, no acute fracture or traumatic malalignment of C-spine.   Interlobular septal thickening which could reflect pulmonary edema, emphysema.   Left shoulder x-ray showed chronic longstanding rotator cuff tear, mild glenohumeral osteoarthritis.  MRI brain revealed no acute intracranial abnormality.  Age-related simple atrophy with mild chronic small vessel ischemic disease reported.   Right upper quadrant ultrasound showed subtle liver surface nodularity concerning for cirrhosis, no discrete focal liver lesion.  Small amount of gallbladder sludge   Admitted to [facility]  Subjective: Patient was seen and examined this morning. Elderly male.  Somnolent, tries to open eyes on touch.  Unable to movement few words.  Unable to have a meaningful conversation.  No family at bedside  Assessment and plan: Acute metabolic encephalopathy Underlying dementia MRI brain without any acute intracranial abnormality but age-related atrophy and chronic ischemic changes.  Patient probably has underlying dementia. Extensive workup done to identify any potential cause of acute impairment in mental status. Normal ammonia level, normal TSH, folate, vitamin B12 Remains completely altered this morning.  Per  hospitalist note from yesterday, patient was able to eat with help from the staff. Currently on PRN IV Dilaudid and oral oxycodone.  I would stop both of those orders Request palliative care consultation  Acute respiratory failure with hypoxia Community-acquired pneumonia COPD exacerbation Initial chest x-ray imaging suggestive of right upper lobe consolidation. WBC and procalcitonin level are elevated. Patient completed empiric course of antibiotics Continue bronchodilators, Robitussin as needed Recent Labs  Lab 08/16/23 0233 08/21/23 0459  WBC 12.6* 13.3*   Acute systolic CHF Echo showed newly reduced EF 30 to 35% compared to 60% in 2023 Cardiology consultation was obtained. Currently on Coreg 6.25 mg twice daily, nifedipine 60 mg daily  AKI Creatinine rising up. Patient has been altered for several days and has poor appetite. Start IV hydration with D5 half NS Recent Labs    08/13/23 0710 08/14/23 0830 08/15/23 0820 08/15/23 0940 08/16/23 0233 08/17/23 0312 08/18/23 0425 08/19/23 0236 08/21/23 0459 08/22/23 1305  BUN 65* 53* 69* 67* 70* 47* 46* 35* 40* 45*  CREATININE 1.35* 1.25* 1.51* 1.53* 1.48* 1.31* 1.20 1.05 1.36* 1.51*  CO2 30 29 30 30 32 31 30 30 30  19*    Hypernatremia Sodium level running elevated due to poor oral hydration Start D5 half NS at 100 mL/h Recent Labs  Lab 08/16/23 0233 08/17/23 0312 08/18/23 0425 08/19/23 0236 08/21/23 0459 08/22/23 1305  NA 153* 147* 148* 151* 147* 146*   Severe malnutrition Due to poor intake  Impaired mobility  PT eval obtained. SNF recommended.   Goals of care   Code Status: Full Code    DVT prophylaxis:  enoxaparin (LOVENOX) injection 40 mg Start: 08/09/23 1815 SCDs Start: 08/09/23 1801   Antimicrobials: Completed the course Fluid: D5 half NS at  100 mL/h Consultants: Palliative care consulted Family Communication: None at bedside  Status: Inpatient Level of care:  Telemetry Medical   Patient  is from: Home Needs to continue in-hospital care: Waxing and waning mental status Anticipated d/c to: Unclear at this time   Diet:  Diet Order             DIET - DYS 1 Room service appropriate? No; Fluid consistency: Nectar Thick  Diet effective now                   Scheduled Meds:  arformoterol  15 mcg Nebulization BID   budesonide (PULMICORT) nebulizer solution  0.5 mg Nebulization BID   carvedilol  6.25 mg Oral BID WC   enoxaparin (LOVENOX) injection  40 mg Subcutaneous Q24H   feeding supplement  237 mL Oral TID BM   guaiFENesin  15 mL Oral Q6H   multivitamin with minerals  1 tablet Oral Daily   NIFEdipine  60 mg Oral Daily   sodium chloride flush  10-40 mL Intracatheter Q12H   sodium chloride flush  10-40 mL Intracatheter Q12H   sodium chloride flush  3 mL Intravenous Q12H    PRN meds: acetaminophen **OR** acetaminophen, bisacodyl, glucagon (human recombinant), guaiFENesin, hydrALAZINE, levalbuterol **AND** [DISCONTINUED] ipratropium, metoprolol tartrate, nicotine, ondansetron **OR** ondansetron (ZOFRAN) IV, senna-docusate, sodium chloride flush, sodium chloride flush, sodium chloride flush, sodium phosphate   Infusions:   dextrose 5 % and 0.45 % NaCl      Antimicrobials: Anti-infectives (From admission, onward)    Start     Dose/Rate Route Frequency Ordered Stop   08/11/23 1000  azithromycin (ZITHROMAX) tablet 250 mg        250 mg Oral Daily 08/11/23 0852 08/13/23 1006   08/10/23 1700  azithromycin (ZITHROMAX) 250 mg in dextrose 5 % 125 mL IVPB  Status:  Discontinued        250 mg 127.5 mL/hr over 60 Minutes Intravenous Every 24 hours 08/09/23 1759 08/11/23 0852   08/10/23 1000  cefTRIAXone (ROCEPHIN) 1 g in sodium chloride 0.9 % 100 mL IVPB        1 g 200 mL/hr over 30 Minutes Intravenous Every 24 hours 08/09/23 1759 08/13/23 1043   08/09/23 1515  cefTRIAXone (ROCEPHIN) 1 g in sodium chloride 0.9 % 100 mL IVPB        1 g 200 mL/hr over 30 Minutes  Intravenous  Once 08/09/23 1504 08/09/23 1622   08/09/23 1515  azithromycin (ZITHROMAX) 500 mg in sodium chloride 0.9 % 250 mL IVPB        500 mg 250 mL/hr over 60 Minutes Intravenous  Once 08/09/23 1504 08/09/23 1803       Objective: Vitals:   08/22/23 0916 08/22/23 1100  BP: 95/70 101/67  Pulse: 92 86  Resp:    Temp: 97.7 F (36.5 C)   SpO2:      Intake/Output Summary (Last 24 hours) at 08/22/2023 1610 Last data filed at 08/22/2023 1244 Gross per 24 hour  Intake 220 ml  Output --  Net 220 ml   Filed Weights   08/18/23 0500 08/19/23 0500 08/22/23 0300  Weight: 57.6 kg 57.1 kg 54.4 kg   Weight change:  Body mass index is 18.78 kg/m.   Physical Exam: General exam: Pleasant, elderly male.  Not in pain Skin: No rashes, lesions or ulcers.  Looks dry overall HEENT: Atraumatic, normocephalic, no obvious bleeding Lungs: Clear to auscultation bilaterally,  CVS: S1, S2, no murmur,   GI/Abd:  Soft, nontender, nondistended, bowel sound present,   CNS: Somnolent, tries to open eyes on command.  Mumbles few words and falls right back asleep Psychiatry: Unable to assess Extremities: No pedal edema, no calf tenderness,   Data Review: I have personally reviewed the laboratory data and studies available.  F/u labs ordered Unresulted Labs (From admission, onward)     Start     Ordered   08/23/23 0500  CBC with Differential/Platelet  Tomorrow morning,   R       Question:  Specimen collection method  Answer:  Lab=Lab collect   08/22/23 0836   08/23/23 0500  Basic metabolic panel with GFR  Tomorrow morning,   R       Question:  Specimen collection method  Answer:  Lab=Lab collect   08/22/23 0836   08/20/23 0500  Comprehensive metabolic panel with GFR  Daily,   R     Question:  Specimen collection method  Answer:  IV Team=IV Team collect   08/19/23 0806   08/16/23 0500  Creatinine, serum  (enoxaparin (LOVENOX)    CrCl >/= 30 ml/min)  Weekly,   R     Comments: while on enoxaparin  therapy    08/09/23 1801           Total time spent in review of labs and imaging, patient evaluation, formulation of plan, documentation and communication with family: 55 minutes  Signed, Lorin Glass, MD [facility] 08/22/2023

## 2023-08-22 DEATH — deceased

## 2023-08-23 DIAGNOSIS — I160 Hypertensive urgency: Secondary | ICD-10-CM

## 2023-08-23 DIAGNOSIS — E872 Acidosis, unspecified: Secondary | ICD-10-CM | POA: Diagnosis not present

## 2023-08-23 DIAGNOSIS — G9341 Metabolic encephalopathy: Secondary | ICD-10-CM

## 2023-08-23 DIAGNOSIS — Z515 Encounter for palliative care: Secondary | ICD-10-CM

## 2023-08-23 DIAGNOSIS — Z7189 Other specified counseling: Secondary | ICD-10-CM

## 2023-08-23 DIAGNOSIS — J9601 Acute respiratory failure with hypoxia: Secondary | ICD-10-CM | POA: Diagnosis not present

## 2023-08-23 DIAGNOSIS — J189 Pneumonia, unspecified organism: Secondary | ICD-10-CM | POA: Diagnosis not present

## 2023-08-23 DIAGNOSIS — E43 Unspecified severe protein-calorie malnutrition: Secondary | ICD-10-CM

## 2023-08-23 LAB — COMPREHENSIVE METABOLIC PANEL WITH GFR
ALT: 93 U/L — ABNORMAL HIGH (ref 0–44)
AST: 210 U/L — ABNORMAL HIGH (ref 15–41)
Albumin: 2.7 g/dL — ABNORMAL LOW (ref 3.5–5.0)
Alkaline Phosphatase: 110 U/L (ref 38–126)
Anion gap: 6 (ref 5–15)
BUN: 39 mg/dL — ABNORMAL HIGH (ref 8–23)
CO2: 29 mmol/L (ref 22–32)
Calcium: 10.4 mg/dL — ABNORMAL HIGH (ref 8.9–10.3)
Chloride: 115 mmol/L — ABNORMAL HIGH (ref 98–111)
Creatinine, Ser: 1.34 mg/dL — ABNORMAL HIGH (ref 0.61–1.24)
GFR, Estimated: 56 mL/min — ABNORMAL LOW (ref >60)
Glucose, Bld: 102 mg/dL — ABNORMAL HIGH (ref 70–99)
Potassium: 4.2 mmol/L (ref 3.5–5.1)
Sodium: 150 mmol/L — ABNORMAL HIGH (ref 135–145)
Total Bilirubin: 3.5 mg/dL — ABNORMAL HIGH (ref 0.0–1.2)
Total Protein: 7 g/dL (ref 6.5–8.1)

## 2023-08-23 LAB — GLUCOSE, CAPILLARY
Glucose-Capillary: 92 mg/dL (ref 70–99)
Glucose-Capillary: 93 mg/dL (ref 70–99)
Glucose-Capillary: 106 mg/dL — ABNORMAL HIGH (ref 70–99)

## 2023-08-23 LAB — CBC WITH DIFFERENTIAL/PLATELET
Abs Immature Granulocytes: 0.11 10*3/uL — ABNORMAL HIGH (ref 0.00–0.07)
Basophils Absolute: 0 10*3/uL (ref 0.0–0.1)
Basophils Relative: 0 %
Eosinophils Absolute: 0 10*3/uL (ref 0.0–0.5)
Eosinophils Relative: 0 %
HCT: 61.6 % — ABNORMAL HIGH (ref 39.0–52.0)
Hemoglobin: 20.5 g/dL — ABNORMAL HIGH (ref 13.0–17.0)
Immature Granulocytes: 1 %
Lymphocytes Relative: 10 %
Lymphs Abs: 1.6 10*3/uL (ref 0.7–4.0)
MCH: 36.1 pg — ABNORMAL HIGH (ref 26.0–34.0)
MCHC: 33.3 g/dL (ref 30.0–36.0)
MCV: 108.5 fL — ABNORMAL HIGH (ref 80.0–100.0)
Monocytes Absolute: 1.4 10*3/uL — ABNORMAL HIGH (ref 0.1–1.0)
Monocytes Relative: 9 %
Neutro Abs: 13 10*3/uL — ABNORMAL HIGH (ref 1.7–7.7)
Neutrophils Relative %: 80 %
Platelets: 156 10*3/uL (ref 150–400)
RBC: 5.68 MIL/uL (ref 4.22–5.81)
RDW: 12.9 % (ref 11.5–15.5)
WBC: 16.1 10*3/uL — ABNORMAL HIGH (ref 4.0–10.5)
nRBC: 0 % (ref 0.0–0.2)

## 2023-08-23 MED ORDER — THIAMINE MONONITRATE 100 MG PO TABS
100.0000 mg | ORAL_TABLET | Freq: Every day | ORAL | Status: DC
Start: 2023-08-23 — End: 2023-08-24
  Administered 2023-08-23 – 2023-08-24 (×2): 100 mg
  Filled 2023-08-23 (×2): qty 1

## 2023-08-23 MED ORDER — HYDROMORPHONE HCL 1 MG/ML IJ SOLN: 0.5000 mg | INTRAMUSCULAR | Status: DC | PRN

## 2023-08-23 MED ORDER — JEVITY 1.5 CAL/FIBER PO LIQD
1000.0000 mL | ORAL | Status: DC
  Administered 2023-08-23 – 2023-08-24 (×2): 1000 mL
  Filled 2023-08-23 (×5): qty 1000

## 2023-08-23 MED ORDER — HYDROMORPHONE HCL 1 MG/ML IJ SOLN
0.5000 mg | INTRAMUSCULAR | Status: AC
  Administered 2023-08-23: 0.5 mg via INTRAVENOUS
  Filled 2023-08-23: qty 0.5

## 2023-08-23 MED ORDER — DEXTROSE 5 % IV SOLN: INTRAVENOUS | Status: AC

## 2023-08-23 MED ORDER — ENSURE ENLIVE PO LIQD: 237.0000 mL | Freq: Two times a day (BID) | ORAL | Status: DC

## 2023-08-23 MED ORDER — HYDROMORPHONE HCL 1 MG/ML IJ SOLN: 0.5000 mg | Freq: Once | INTRAMUSCULAR | Status: DC

## 2023-08-23 MED ORDER — QUETIAPINE FUMARATE 25 MG PO TABS
25.0000 mg | ORAL_TABLET | Freq: Every day | ORAL | Status: DC
  Administered 2023-08-23 – 2023-08-24 (×2): 25 mg via ORAL
  Filled 2023-08-23 (×2): qty 1

## 2023-08-23 MED ORDER — ADULT MULTIVITAMIN W/MINERALS CH
1.0000 | ORAL_TABLET | Freq: Every day | ORAL | Status: DC
  Administered 2023-08-24 – 2023-08-26 (×3): 1
  Filled 2023-08-23 (×3): qty 1

## 2023-08-23 MED ORDER — FREE WATER
200.0000 mL | Freq: Four times a day (QID) | Status: DC
Start: 2023-08-23 — End: 2023-08-26
  Administered 2023-08-23 – 2023-08-26 (×13): 200 mL

## 2023-08-23 MED ORDER — LORAZEPAM 2 MG/ML IJ SOLN
0.5000 mg | Freq: Four times a day (QID) | INTRAMUSCULAR | Status: DC | PRN
  Administered 2023-08-23 (×2): 0.5 mg via INTRAVENOUS
  Filled 2023-08-23 (×3): qty 1

## 2023-08-23 MED ORDER — PROSOURCE TF20 ENFIT COMPATIBL EN LIQD
60.0000 mL | Freq: Every day | ENTERAL | Status: DC
  Administered 2023-08-23 – 2023-08-26 (×4): 60 mL
  Filled 2023-08-23 (×4): qty 60

## 2023-08-23 MED ORDER — VITAL HIGH PROTEIN PO LIQD: 1000.0000 mL | ORAL | Status: DC

## 2023-08-23 NOTE — Progress Notes (Signed)
 Speech Language Pathology Treatment: Dysphagia  Patient Details Name: Francisco Garcia MRN: [id number] DOB: [birth year] Today's Date: 08/23/2023 Time: 1345-1400 SLP Time Calculation (min) (ACUTE ONLY): 15 min  Assessment / Plan / Recommendation Clinical Impression  Patient seen by SLP for skilled treatment focused on dysphagia goals. Patient was lying in bed, appeared lethargic but able to arouse minimally. Initially, he was resistant to oral care but progressively was more engaged and tolerated it. Oral mucosa was extremely dry with what appeared to be sticky secretions mixed with PO's (orange colored). SLP performed oral care with toothette suction sponges and toothbrush. Patient then accepted toothette sponges soaked in water followed by approximately 1/4 spoon size sips of plain, thin water. One instance of delayed cough response observed and although mobilization of secretions was heard, patient unable to expectorate or transit into oral cavity. SLP will continue to follow.   HPI HPI: Patient is a 73 y.o. 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 He presented to the hospital on 08/09/23 for evaluation following fall/syncope, Barrett's esophagus, GERD. SLP swallow evaluation ordered after RN observed him to get choked and turn purple after a bite of food and sip of juice. Per RD, patient reportedly did well with eating previous date.      SLP Plan  Continue with current plan of care      Recommendations for follow up therapy are one component of a multi-disciplinary discharge planning process, led by the attending physician.  Recommendations may be updated based on patient status, additional functional criteria and insurance authorization.    Recommendations  Diet recommendations: Dysphagia 1 (puree);Nectar-thick liquid Liquids provided via: Cup;Straw Medication Administration: Crushed with puree Supervision: Full  supervision/cueing for compensatory strategies;Staff to assist with self feeding Compensations: Slow rate;Small sips/bites;Minimize environmental distractions Postural Changes and/or Swallow Maneuvers: Seated upright 90 degrees                  Oral care prior to ice chip/H20;Oral care BID;Staff/trained caregiver to provide oral care   Frequent or constant Supervision/Assistance Dysphagia, oropharyngeal phase (R13.12)     Continue with current plan of care     Angela Nevin, MA, CCC-SLP Speech Therapy

## 2023-08-23 NOTE — Progress Notes (Signed)
 Nutrition Follow-up  DOCUMENTATION CODES:   Severe malnutrition in context of chronic illness  INTERVENTION:   Initiate tube feeding when access is obtained and confirmed: Jevity 1.5 at 25 ml and increase by 10 ml every 8 hours until 50 ml/h (1200 ml per day) Prosource TF20 60 ml daily 200 ml FWF Q6H   Provides 1880 kcal, 96 gm protein, 912 ml free water daily (1712 ml water daily TF + FWF)  Continue dysphagia 1 diet, nectar thick liquids with feeding assistance  Ensure Enlive PO to BID. Each supplement provides 350 kcal and 20 grams of protein. * Has to be thickened to nectar thick consistency  Magic cup TID with meals, each supplement provides 290 kcal and 9 grams of protein  Continue MVI with minerals per tube 100 mg Thiamine per tube  Monitor magnesium, potassium, and phosphorus daily for at least 3 days, MD to replete as needed, as pt is at risk for refeeding syndrome    NUTRITION DIAGNOSIS:   Severe Malnutrition related to chronic illness as evidenced by severe fat depletion, severe muscle depletion.  - Still applicable   GOAL:   Patient will meet greater than or equal to 90% of their needs  - Progressing   MONITOR:   PO intake, Supplement acceptance, Labs, I & O's, Weight trends  REASON FOR ASSESSMENT:   Consult Assessment of nutrition requirement/status  ASSESSMENT:   73 y.o. male presented to the ED with fall/syncope. PMH includes COPD, CAD s/p CABG, HTN, PAD, bladder cancer, and CHF. Pt admitted with acute respiratory failure 2/2 PNA, altered mental status, and AKI.  3/20 - 2 gram low sodium regular diet 3/24 - Dysphagia 1, thin liquids - Doing well meeting 81% of calorie and 96% protein needs 3/26 - Dysphagia 1, nectar thick liquids - Pt aspirated and liquids were thickened 4/2 - Cortrak to be placed due to fluctuating mental status and PO intake   Patient resting in bed with family at bedside, patient did not open eyes,was just given Ativan per RN due  to agitation. Family expresses concern over patients poor PO intake.  Yesterday he went from eating 50% of his breakfast to only eating bites of his meals throughout the day. Did not eat any breakfast today. Does not drink the thickened Ensures, but does eat the Magic cups.   RD expressed concern over patients fluctuating mental status and PO intake, as well as patient having severe muscle and fat wasting. Discussed options with family regarding PO intake, they are agreeable to a feeding tube. Per RN patient pulls out current IVs and will have to be restrained with a feeding tube in place. Hopefully with a feeding tube in place and with adequate water flushes, his high sodium levels will trend back to normal.    Admit weight: 66.7 kg Current weight: 54.4 kg    Average Meal Intake: 3/27-4/1: 53% intake x 8 recorded meals  Nutritionally Relevant Medications: Scheduled Meds:  feeding supplement  237 mL Oral BID BM   feeding supplement (PROSource TF20)  60 mL Per Tube Daily   free water  200 mL Per Tube Q6H   guaiFENesin  15 mL Oral Q6H   [START ON 08/24/2023] multivitamin with minerals  1 tablet Per Tube Daily   thiamine  100 mg Per Tube Daily   Continuous Infusions:  dextrose 75 mL/hr at 08/23/23 0927   feeding supplement (JEVITY 1.5 CAL/FIBER)     Labs Reviewed: Sodium 150, BUN 39, Creatinine 1.34, Calcium 10.4,  Magnesium 3.2, AST 210, ALT 93,  CBG ranges from 92-147 mg/dL over the last 24 hours  Diet Order:   Diet Order             DIET - DYS 1 Room service appropriate? No; Fluid consistency: Nectar Thick  Diet effective now                   EDUCATION NEEDS:   Education needs have been addressed  Skin:  Skin Assessment: Reviewed RN Assessment  Last BM:  08/23/23 type 6, medium  Height:   Ht Readings from Last 1 Encounters:  08/09/23 5\' 7"  (1.702 m)    Weight:   Wt Readings from Last 1 Encounters:  08/22/23 54.4 kg    Ideal Body Weight:  67.3 kg  BMI:  Body  mass index is 18.78 kg/m.  Estimated Nutritional Needs:   Kcal:  1800-2000  Protein:  90-110 grams  Fluid:  >/= 1.8 L   Elliot Dally, RD Registered Dietitian  See Amion for more information

## 2023-08-23 NOTE — Procedures (Signed)
 Cortrak  Person Inserting Tube:  Shaden Higley T, RD Tube Type:  Cortrak - 43 inches Tube Size:  10 Tube Location:  Left nare Initial Placement:  Stomach Secured by: Bridle Technique Used to Measure Tube Placement:  Marking at nare/corner of mouth Cortrak Secured At:  69 cm   Cortrak Tube Team Note:  Consult received to place a Cortrak feeding tube.   No x-ray is required. RN may begin using tube.   If the tube becomes dislodged please keep the tube and contact the Cortrak team at [email or web address] for replacement.  If after hours and replacement cannot be delayed, place a NG tube and confirm placement with an abdominal x-ray.    Shelle Iron RD, LDN Contact via [employer].

## 2023-08-23 NOTE — Consult Note (Signed)
 Consultation Note Date: 08/23/2023   Patient Name: Francisco Garcia  DOB: 06-01-1950  MRN: [id number]  Age / Sex: 73 y.o., male  PCP: [name], MD Referring Physician: Lorin Glass, MD  Reason for Consultation: Establishing goals of care  HPI/Patient Profile: 73 y.o. male  with past medical history of COPD, CAD status post CABG, HTN, PAD, MI, bladder cancer, paroxysmal A-fib, TIA, carotid artery stenosis, memory impairment admitted on 08/09/2023 with a fall/syncope, altered mental status.   Workup in the ED showed creatinine was slightly elevated, had lactic acidosis and rhabdomyolysis. Chest x-ray revealed new heterogenous opacity overlying the medial aspect of the right upper lung zone concerning for consolidation. He was admitted for acute hypoxic respiratory failure with hypoxia secondary to community-acquired pneumonia and COPD exacerbation, AKI, also found to have acute systolic CHF with EF now 30-25% (was 60% in 2023). He has had agitation, sundowning, and issues with pain during this hospitalization.  PMT has been consulted to assist with goals of care conversation.  Clinical Assessment and Goals of Care:  I have reviewed medical records including EPIC notes, labs and imaging, assessed the patient and then had a phone conversation with patient's daughter and then with patient's son to discuss diagnosis prognosis, GOC, EOL wishes, disposition and options.  I introduced Palliative Medicine as specialized medical care for people living with serious illness. It focuses on providing relief from the symptoms and stress of a serious illness. The goal is to improve quality of life for both the patient and the family.  We discussed a brief life review of the patient and then focused on their current illness.  The natural disease trajectory and expectations at EOL were discussed.  I attempted to elicit values and goals of care important to the patient.     Medical History Review and Understanding:  Provide education on disease progression in patients with dementia, including eligibility criteria for hospice and concerning symptoms such as inability to walk, swallowing difficulties, incontinence, recurrent hospitalizations, limited ability to communicate only few words.  Reviewed the roles of patient's acute illness, prolonged hospitalization, and other risk factors and disease progression.  Social History: Patient has a son and a daughter.  He has been cared for by his daughter for the past 6 or 7 months.  He was cared for by his son for a few years before that.  Patient is a Curator.  He previously enjoyed going out to eat with his daughter.  Functional and Nutritional State: Patient is nonambulatory.  He is only able to say few words including "I love you."  He has poor oral intake.  Palliative Symptoms: Per chart, agitation and pain  Advance Directives: A detailed discussion regarding advanced directives was had.  Per his son and daughter, patient has never completed or discussed his EOL wishes.   Code Status: Concepts specific to code status, artifical feeding and hydration, and rehospitalization were considered and discussed. Recommended consideration of DNR status, understanding evidenced-based poor outcomes in similar hospitalized patients, as the cause of the arrest is likely associated with chronic/terminal disease rather than a reversible acute cardio-pulmonary event.    Discussion: Called daughter first and then spoke with son.  Emotional support and therapeutic listening was provided to patient's daughter, who is having a hard time processing the updates received today regarding the severity of her father's illness.  She knew he was doing poorly, but did not realize the extent or how close he may be to end-of-life.  She is not sure  how her children will process this.  We discussed coping strategies.  During my conversation with  patient's son, we discussed patient's previous hospitalization and close calls with severe illness, experience with rehabilitation, etc.  Son understands that he is not getting better and doing very poorly.  He will not be back in town until Saturday and he is open to a family meeting at that time to discuss options, including transition to comfort focused care, if patient continues to be doing poorly.  Ultimately, they feel like patient would not want to suffer, though family would also like to see how he does with more time on treatment and nutritional support.  Son is very concerned about patient pulling out his feeding tube.  He is open to discussing hospice further and has already had some conversations about this with patient's sister.  Unfortunately, patient's other sister died of cancer around a month and a half ago.  He would also like to discuss DNR further with his sister before making a final decision, though he is leaning this way.  Received a return call from daughter Francisco Garcia after being disconnected and she voiced her agreement with DNR, though she will call her brother and discuss amongst himself before final decision.  She is also in agreement with the tentative meeting on Saturday.  Received another return call confirming family's decision for DNR/DNI.   The difference between aggressive medical intervention and comfort care was considered in light of the patient's goals of care. Hospice and Palliative Care services outpatient were explained and offered.   Discussed the importance of continued conversation with family and the medical providers regarding overall plan of care and treatment options, ensuring decisions are within the context of the patient's values and GOCs.   Questions and concerns were addressed. The family was encouraged to call with questions or concerns.  PMT will continue to support holistically.   SUMMARY OF RECOMMENDATIONS   -Code status changed to DNR/DNI    -Continue current care for another few days, more time for outcomes.  Patient's family understand the severity of his illness and poor prognosis -Spiritual care consult at daughter's request -Psychosocial and emotional for provided -Tentative family meeting on Saturday 4/5 when patient's son is back in town -Ongoing goals of care discussions -PMT will continue to follow and support  Prognosis:  Very poor with high risk for further decline  Discharge Planning: To Be Determined      Primary Diagnoses: Present on Admission:  CAP (community acquired pneumonia)  Fall  Acute metabolic encephalopathy  Acute respiratory failure with hypoxia (HCC)  COPD with acute exacerbation (HCC)  Lactic acidosis  Hypertensive urgency   Physical Exam Vitals and nursing note reviewed.  Constitutional:      General: He is sleeping. He is not in acute distress.    Appearance: He is ill-appearing.     Interventions: Nasal cannula in place.  Cardiovascular:     Rate and Rhythm: Normal rate.  Pulmonary:     Effort: Pulmonary effort is normal.    Vital Signs: BP 124/83 (BP Location: Right Arm)   Pulse 65   Temp 97.8 F (36.6 C)   Resp 16   Ht 5\' 7"  (1.702 m)   Wt 54.4 kg   SpO2 95%   BMI 18.78 kg/m  Pain Scale: PAINAD   Pain Score: 0-No pain   SpO2: SpO2: 95 % O2 Device:SpO2: 95 % O2 Flow Rate: .O2 Flow Rate (L/min): 3 L/min    MDM: High  Shamell Hittle Jeni Salles, PA-C  Palliative Medicine Team Team phone # [phone]  Thank you for allowing the Palliative Medicine Team to assist in the care of this patient. Please utilize secure chat with additional questions, if there is no response within 30 minutes please call the above phone number.  Palliative Medicine Team providers are available by phone from 7am to 7pm daily and can be reached through the team cell phone.  Should this patient require assistance outside of these hours, please call the patient's attending physician.

## 2023-08-23 NOTE — TOC Progression Note (Signed)
 Transition of Care [facility]) - Progression Note    Patient Details  Name: Francisco Garcia MRN: [id number] Date of Birth: 1950/08/24  Transition of Care [facility]) CM/SW Contact  Geron Mulford A [place], LCSW Phone Number: 08/23/2023, 2:18 PM  Clinical Narrative:     CSW was contacted by [facility]. Irving Burton informed CSW that they are working to get approval with their cooperate office for pt's Medicaid to facility in event authorization through pt's Medicare does not get approved.   She said that she has spoken with several family members and that pt's family has been assisting pt with his bills but they still need to provide documentation of pt's expenses and bills.   Pt's authorization remains pending at this time.   Auth ID: [id number]  Reference ID: [id number]   TOC will continue to follow.        Expected Discharge Plan and Services                                               Social Determinants of Health (SDOH) Interventions SDOH Screenings   Food Insecurity: Patient Unable To Answer (08/09/2023)  Housing: Patient Unable To Answer (08/09/2023)  Transportation Needs: Patient Unable To Answer (08/09/2023)  Utilities: Patient Unable To Answer (08/09/2023)  Tobacco Use: Medium Risk (08/09/2023)    Readmission Risk Interventions     No data to display

## 2023-08-23 NOTE — Progress Notes (Signed)
 Dementia RN reported that patient is very restless complaining about pain and throwing stuff towards the nursing staff.  It is very hard to manage this patient's anxiety. - Giving Dilaudid one-time dose and continue Ativan as needed for anxiety.  Tereasa Coop, MD [facility] 08/23/2023, 1:18 AM

## 2023-08-23 NOTE — Progress Notes (Addendum)
 PROGRESS NOTE  Francisco Garcia  DOB: January 16, 1951  PCP: [name], MD ZOX:[id number]  DOA: 08/09/2023  LOS: 14 days  Hospital Day: 15  Brief narrative: Francisco Garcia is a 73 y.o. male with PMH significant for HTN, CAD/CABG, paroxysmal A-fib, TIA, carotid artery stenosis, PAD, COPD, memory impairment, bladder cancer. 3/19, patient presented to the ED with a fall, altered mental status  No in the ED, he was short of breath, had diffuse bilateral rhonchi Workup showed lactic acidosis of 3, CK level elevated 750 Chest x-ray revealed new heterogenous opacity overlying the medial aspect of the right upper lung zone concerning for consolidation.  CT head C-spine revealed no CT evidence of acute intracranial abnormality, no acute fracture or traumatic malalignment of C-spine.   Interlobular septal thickening which could reflect pulmonary edema, emphysema.   Left shoulder x-ray showed chronic longstanding rotator cuff tear, mild glenohumeral osteoarthritis.  MRI brain revealed no acute intracranial abnormality.  Age-related simple atrophy with mild chronic small vessel ischemic disease reported.   Right upper quadrant ultrasound showed subtle liver surface nodularity concerning for cirrhosis, no discrete focal liver lesion.  Small amount of gallbladder sludge   Admitted to [facility]  Subjective: Patient was seen and examined this morning. Events from last night noted. Patient was very restless, complaining of pain and 3 staff towards nursing staff. He was given 1 dose of IV Dilaudid and 1 dose of IV Ativan. Somnolent this morning.  On sternal rub, starts moaning, unable to have a conversation or verbalize concern.  Later this morning, I met with patient's son and daughter at bedside.  We had a long conversation.  Family seems unsettled the fact that patient's dementia worsened rapidly faster than their expectation.  Assessment and plan: Acute metabolic encephalopathy Underlying dementia MRI  brain without any acute intracranial abnormality but age-related atrophy and chronic ischemic changes.  Patient probably has underlying dementia. Extensive workup done to identify any potential cause of acute impairment in mental status. Normal ammonia level, normal TSH, folate, vitamin B12 Mental status has been waxing and waning.  It is feeding him between minimal responsiveness and restlessness, agitation.  Will add Seroquel 25 mg at bedtime.  May need some more as well during the day if he becomes restless again. Requested palliative care consultation yesterday. Seen by nutritionist today.  Family wants to proceed with core track feeding today.  Hypernatremia Sodium level is further up today Switch from D5 half NS to D5W at 100 mL/h.  Cautious use of IV fluid given systolic CHF Recent Labs  Lab 81/19/14 0312 08/18/23 0425 08/19/23 0236 08/21/23 0459 08/22/23 1305 08/23/23 0609  NA 147* 148* 151* 147* 146* 150*   Acute respiratory failure with hypoxia Community-acquired pneumonia COPD exacerbation Initial chest x-ray imaging suggestive of right upper lobe consolidation. WBC and procalcitonin level were elevated. Patient completed empiric course of antibiotics Continue bronchodilators, Robitussin as needed Recent Labs  Lab 08/21/23 0459 08/23/23 0609  WBC 13.3* 16.1*   Acute systolic CHF Echo showed newly reduced EF 30 to 35% compared to 60% in 2023 Cardiology consultation was obtained. Currently on Coreg 6.25 mg twice daily, nifedipine 60 mg daily  AKI Creatinine better in the last 24 hours. Continue to monitor Recent Labs    08/14/23 0830 08/15/23 0820 08/15/23 0940 08/16/23 0233 08/17/23 7829 08/18/23 0425 08/19/23 0236 08/21/23 0459 08/22/23 1305 08/23/23 0609  BUN 53* 69* 67* 70* 47* 46* 35* 40* 45* 39*  CREATININE 1.25* 1.51* 1.53* 1.48* 1.31* 1.20 1.05 1.36*  1.51* 1.34*  CO2 29 30 30  32 31 30 30 30  19* 29     Severe malnutrition Due to poor  intake Dietitian recommendation appreciated  Impaired mobility  PT eval obtained. SNF recommended.   Goals of care   Code Status: Full Code    DVT prophylaxis:  enoxaparin (LOVENOX) injection 40 mg Start: 08/09/23 1815 SCDs Start: 08/09/23 1801   Antimicrobials: Completed the course Fluid: D5W at 100 mL/h Consultants: Palliative care consulted Family Communication: Discussed with patient's son and daughter at bedside  Status: Inpatient Level of care:  Telemetry Medical   Patient is from: Home Needs to continue in-hospital care: Waxing and waning mental status Anticipated d/c to: Unclear at this time.  Pending palliative conversation with family   Diet:  Diet Order             DIET - DYS 1 Room service appropriate? No; Fluid consistency: Nectar Thick  Diet effective now                   Scheduled Meds:  arformoterol  15 mcg Nebulization BID   budesonide (PULMICORT) nebulizer solution  0.5 mg Nebulization BID   carvedilol  6.25 mg Oral BID WC   enoxaparin (LOVENOX) injection  40 mg Subcutaneous Q24H   feeding supplement  237 mL Oral TID BM   guaiFENesin  15 mL Oral Q6H   multivitamin with minerals  1 tablet Oral Daily   NIFEdipine  60 mg Oral Daily   QUEtiapine  25 mg Oral QHS   sodium chloride flush  10-40 mL Intracatheter Q12H   sodium chloride flush  10-40 mL Intracatheter Q12H   sodium chloride flush  3 mL Intravenous Q12H    PRN meds: acetaminophen **OR** acetaminophen, bisacodyl, glucagon (human recombinant), guaiFENesin, hydrALAZINE, levalbuterol **AND** [DISCONTINUED] ipratropium, LORazepam, metoprolol tartrate, nicotine, ondansetron **OR** ondansetron (ZOFRAN) IV, senna-docusate, sodium chloride flush, sodium chloride flush, sodium chloride flush, sodium phosphate   Infusions:   dextrose 75 mL/hr at 08/23/23 0927    Antimicrobials: Anti-infectives (From admission, onward)    Start     Dose/Rate Route Frequency Ordered Stop   08/11/23 1000   azithromycin (ZITHROMAX) tablet 250 mg        250 mg Oral Daily 08/11/23 0852 08/13/23 1006   08/10/23 1700  azithromycin (ZITHROMAX) 250 mg in dextrose 5 % 125 mL IVPB  Status:  Discontinued        250 mg 127.5 mL/hr over 60 Minutes Intravenous Every 24 hours 08/09/23 1759 08/11/23 0852   08/10/23 1000  cefTRIAXone (ROCEPHIN) 1 g in sodium chloride 0.9 % 100 mL IVPB        1 g 200 mL/hr over 30 Minutes Intravenous Every 24 hours 08/09/23 1759 08/13/23 1043   08/09/23 1515  cefTRIAXone (ROCEPHIN) 1 g in sodium chloride 0.9 % 100 mL IVPB        1 g 200 mL/hr over 30 Minutes Intravenous  Once 08/09/23 1504 08/09/23 1622   08/09/23 1515  azithromycin (ZITHROMAX) 500 mg in sodium chloride 0.9 % 250 mL IVPB        500 mg 250 mL/hr over 60 Minutes Intravenous  Once 08/09/23 1504 08/09/23 1803       Objective: Vitals:   08/23/23 0408 08/23/23 0807  BP: 124/83   Pulse: 67 65  Resp: 17 16  Temp: 97.8 F (36.6 C)   SpO2:  95%    Intake/Output Summary (Last 24 hours) at 08/23/2023 1310 Last data filed at  08/23/2023 0256 Gross per 24 hour  Intake 778.51 ml  Output --  Net 778.51 ml   Filed Weights   08/18/23 0500 08/19/23 0500 08/22/23 0300  Weight: 57.6 kg 57.1 kg 54.4 kg   Weight change:  Body mass index is 18.78 kg/m.   Physical Exam: General exam: Pleasant, elderly male.  Not in pain Skin: No rashes, lesions or ulcers.  Looks dry overall HEENT: Atraumatic, normocephalic, no obvious bleeding Lungs: Clear to auscultation bilaterally,  CVS: S1, S2, no murmur,   GI/Abd: Soft, nontender, nondistended, bowel sound present,   CNS: Somnolent, moans on sternal rub.  Unable to have verbalize concern or have a meaningful conversation Psychiatry: Unable to assess Extremities: No pedal edema, no calf tenderness,   Data Review: I have personally reviewed the laboratory data and studies available.  F/u labs ordered Unresulted Labs (From admission, onward)     Start     Ordered    08/16/23 0500  Creatinine, serum  (enoxaparin (LOVENOX)    CrCl >/= 30 ml/min)  Weekly,   R     Comments: while on enoxaparin therapy    08/09/23 1801           Total time spent in review of labs and imaging, patient evaluation, formulation of plan, documentation and communication with family: 45 minutes  Signed, Lorin Glass, MD [facility] 08/23/2023

## 2023-08-24 DIAGNOSIS — R4182 Altered mental status, unspecified: Secondary | ICD-10-CM | POA: Diagnosis not present

## 2023-08-24 DIAGNOSIS — J9601 Acute respiratory failure with hypoxia: Secondary | ICD-10-CM | POA: Diagnosis not present

## 2023-08-24 DIAGNOSIS — J441 Chronic obstructive pulmonary disease with (acute) exacerbation: Secondary | ICD-10-CM

## 2023-08-24 DIAGNOSIS — J189 Pneumonia, unspecified organism: Secondary | ICD-10-CM | POA: Diagnosis not present

## 2023-08-24 DIAGNOSIS — G9341 Metabolic encephalopathy: Secondary | ICD-10-CM | POA: Diagnosis not present

## 2023-08-24 LAB — BASIC METABOLIC PANEL WITH GFR
Anion gap: 9 (ref 5–15)
BUN: 32 mg/dL — ABNORMAL HIGH (ref 8–23)
CO2: 25 mmol/L (ref 22–32)
Calcium: 9.7 mg/dL (ref 8.9–10.3)
Chloride: 108 mmol/L (ref 98–111)
Creatinine, Ser: 1.31 mg/dL — ABNORMAL HIGH (ref 0.61–1.24)
GFR, Estimated: 57 mL/min — ABNORMAL LOW (ref >60)
Glucose, Bld: 78 mg/dL (ref 70–99)
Potassium: 3.8 mmol/L (ref 3.5–5.1)
Sodium: 142 mmol/L (ref 135–145)

## 2023-08-24 LAB — POTASSIUM: Potassium: 3.8 mmol/L (ref 3.5–5.1)

## 2023-08-24 LAB — GLUCOSE, CAPILLARY
Glucose-Capillary: 125 mg/dL — ABNORMAL HIGH (ref 70–99)
Glucose-Capillary: 102 mg/dL — ABNORMAL HIGH (ref 70–99)
Glucose-Capillary: 122 mg/dL — ABNORMAL HIGH (ref 70–99)
Glucose-Capillary: 123 mg/dL — ABNORMAL HIGH (ref 70–99)
Glucose-Capillary: 152 mg/dL — ABNORMAL HIGH (ref 70–99)

## 2023-08-24 LAB — MAGNESIUM: Magnesium: 2.1 mg/dL (ref 1.7–2.4)

## 2023-08-24 LAB — PHOSPHORUS: Phosphorus: 1.9 mg/dL — ABNORMAL LOW (ref 2.5–4.6)

## 2023-08-24 MED ORDER — THIAMINE MONONITRATE 100 MG PO TABS
100.0000 mg | ORAL_TABLET | Freq: Every day | ORAL | Status: DC
  Administered 2023-08-25 – 2023-08-26 (×2): 100 mg via ORAL
  Filled 2023-08-24 (×2): qty 1

## 2023-08-24 NOTE — Progress Notes (Signed)
 PROGRESS NOTE  Francisco Garcia  DOB: 12/22/1950  PCP: [name], MD BJY:[id number]  DOA: 08/09/2023  LOS: 15 days  Hospital Day: 16  Brief narrative: Francisco Garcia is a 73 y.o. male with PMH significant for HTN, CAD/CABG, paroxysmal A-fib, TIA, carotid artery stenosis, PAD, COPD, memory impairment, bladder cancer. 3/19, patient presented to the ED with a fall, altered mental status  No in the ED, he was short of breath, had diffuse bilateral rhonchi Workup showed lactic acidosis of 3, CK level elevated 750 Chest x-ray revealed new heterogenous opacity overlying the medial aspect of the right upper lung zone concerning for consolidation.  CT head C-spine revealed no CT evidence of acute intracranial abnormality, no acute fracture or traumatic malalignment of C-spine.   Interlobular septal thickening which could reflect pulmonary edema, emphysema.   Left shoulder x-ray showed chronic longstanding rotator cuff tear, mild glenohumeral osteoarthritis.  MRI brain revealed no acute intracranial abnormality.  Age-related simple atrophy with mild chronic small vessel ischemic disease reported.   Right upper quadrant ultrasound showed subtle liver surface nodularity concerning for cirrhosis, no discrete focal liver lesion.  Small amount of gallbladder sludge   Admitted to [facility]  Subjective: Patient was seen and examined this morning. Lying on bed.  Curled up with both knees on his chest. Moans when I try to extend his legs. On sternal rub, morning, does not wake up to have a conversation.   Has core track tube in place. Family not at bedside  Assessment and plan: Acute metabolic encephalopathy Underlying dementia MRI brain without any acute intracranial abnormality but age-related atrophy and chronic ischemic changes.  Patient probably has underlying dementia. Extensive workup done to identify any potential cause of acute impairment in mental status. Normal ammonia level, normal TSH,  folate, vitamin B12 Mental status has been waxing and waning.  His mental status is swinging between minimal responsiveness and restlessness, agitation.   Currently on Seroquel 25 mg nightly.   Palliative care consultation appreciated. On core track feeding since 4/2.  Hypernatremia Sodium level better today with correction of dehydration with D5W and tube feeding Recent Labs  Lab 08/18/23 0425 08/19/23 0236 08/21/23 0459 08/22/23 1305 08/23/23 0609 08/24/23 0727  NA 148* 151* 147* 146* 150* 142   Acute respiratory failure with hypoxia Community-acquired pneumonia COPD exacerbation Initial chest x-ray imaging suggestive of right upper lobe consolidation. WBC and procalcitonin level were elevated. Patient completed empiric course of antibiotics WBC count uptrending.  No fever.  Suspect atelectasis.  Continue to monitor Continue bronchodilators, Robitussin as needed Recent Labs  Lab 08/21/23 0459 08/23/23 0609  WBC 13.3* 16.1*   Acute systolic CHF Echo showed newly reduced EF 30 to 35% compared to 60% in 2023 Cardiology consultation was obtained. Currently on Coreg 6.25 mg twice daily, nifedipine 60 mg daily  AKI Creatinine improving in the last 48 hours. Continue to monitor Recent Labs    08/15/23 0820 08/15/23 0940 08/16/23 0233 08/17/23 0865 08/18/23 0425 08/19/23 0236 08/21/23 0459 08/22/23 1305 08/23/23 0609 08/24/23 0727  BUN 69* 67* 70* 47* 46* 35* 40* 45* 39* 32*  CREATININE 1.51* 1.53* 1.48* 1.31* 1.20 1.05 1.36* 1.51* 1.34* 1.31*  CO2 30 30 32 31 30 30 30  19* 29 25    Severe malnutrition Due to poor intake Dietitian recommendation appreciated  Impaired mobility  PT eval obtained. SNF recommended.   Goals of care   Code Status: Limited: Do not attempt resuscitation (DNR) -DNR-LIMITED -Do Not Intubate/DNI     DVT  prophylaxis:  enoxaparin (LOVENOX) injection 40 mg Start: 08/09/23 1815 SCDs Start: 08/09/23 1801   Antimicrobials: Completed  the course Fluid: D5W at 100 mL/h Consultants: Palliative care consulted Family Communication: Discussed with patient's son and daughter at bedside  Status: Inpatient Level of care:  Telemetry Medical   Patient is from: Home Needs to continue in-hospital care: Waxing and waning mental status Anticipated d/c to: Unclear at this time.  Mental status not stable for discharge   Diet:  Diet Order             DIET - DYS 1 Room service appropriate? No; Fluid consistency: Nectar Thick  Diet effective now                   Scheduled Meds:  arformoterol  15 mcg Nebulization BID   budesonide (PULMICORT) nebulizer solution  0.5 mg Nebulization BID   carvedilol  6.25 mg Oral BID WC   enoxaparin (LOVENOX) injection  40 mg Subcutaneous Q24H   feeding supplement  237 mL Oral BID BM   feeding supplement (PROSource TF20)  60 mL Per Tube Daily   free water  200 mL Per Tube Q6H   guaiFENesin  15 mL Oral Q6H   multivitamin with minerals  1 tablet Per Tube Daily   NIFEdipine  60 mg Oral Daily   QUEtiapine  25 mg Oral QHS   sodium chloride flush  10-40 mL Intracatheter Q12H   sodium chloride flush  10-40 mL Intracatheter Q12H   sodium chloride flush  3 mL Intravenous Q12H   thiamine  100 mg Per Tube Daily    PRN meds: acetaminophen **OR** acetaminophen, bisacodyl, glucagon (human recombinant), guaiFENesin, hydrALAZINE, levalbuterol **AND** [DISCONTINUED] ipratropium, LORazepam, metoprolol tartrate, nicotine, ondansetron **OR** ondansetron (ZOFRAN) IV, senna-docusate, sodium chloride flush, sodium chloride flush, sodium chloride flush, sodium phosphate   Infusions:   feeding supplement (JEVITY 1.5 CAL/FIBER) 45 mL/hr at 08/24/23 0833    Antimicrobials: Anti-infectives (From admission, onward)    Start     Dose/Rate Route Frequency Ordered Stop   08/11/23 1000  azithromycin (ZITHROMAX) tablet 250 mg        250 mg Oral Daily 08/11/23 0852 08/13/23 1006   08/10/23 1700  azithromycin  (ZITHROMAX) 250 mg in dextrose 5 % 125 mL IVPB  Status:  Discontinued        250 mg 127.5 mL/hr over 60 Minutes Intravenous Every 24 hours 08/09/23 1759 08/11/23 0852   08/10/23 1000  cefTRIAXone (ROCEPHIN) 1 g in sodium chloride 0.9 % 100 mL IVPB        1 g 200 mL/hr over 30 Minutes Intravenous Every 24 hours 08/09/23 1759 08/13/23 1043   08/09/23 1515  cefTRIAXone (ROCEPHIN) 1 g in sodium chloride 0.9 % 100 mL IVPB        1 g 200 mL/hr over 30 Minutes Intravenous  Once 08/09/23 1504 08/09/23 1622   08/09/23 1515  azithromycin (ZITHROMAX) 500 mg in sodium chloride 0.9 % 250 mL IVPB        500 mg 250 mL/hr over 60 Minutes Intravenous  Once 08/09/23 1504 08/09/23 1803       Objective: Vitals:   08/24/23 0500 08/24/23 0826  BP: 111/71 118/68  Pulse: 72 78  Resp: 19 18  Temp: 98.4 F (36.9 C) 98.1 F (36.7 C)  SpO2: 91% 90%    Intake/Output Summary (Last 24 hours) at 08/24/2023 1347 Last data filed at 08/24/2023 1000 Gross per 24 hour  Intake 1717.74 ml  Output --  Net 1717.74 ml   Filed Weights   08/18/23 0500 08/19/23 0500 08/22/23 0300  Weight: 57.6 kg 57.1 kg 54.4 kg   Weight change:  Body mass index is 18.78 kg/m.   Physical Exam: General exam: Pleasant, elderly male.  Thin built.  Curled up  Skin: No rashes, lesions or ulcers.  Looks dry overall HEENT: Atraumatic, normocephalic, no obvious bleeding Lungs: Clear to auscultation bilaterally,  CVS: S1, S2, no murmur,   GI/Abd: Soft, nontender, nondistended, bowel sound present,   CNS: Somnolent, moans on sternal rub.  Unable to have verbalize concern or have a meaningful conversation Psychiatry: Unable to assess Extremities: No pedal edema, no calf tenderness,   Data Review: I have personally reviewed the laboratory data and studies available.  F/u labs ordered Unresulted Labs (From admission, onward)     Start     Ordered   08/25/23 0500  Basic metabolic panel with GFR  Daily,   R     Question:  Specimen  collection method  Answer:  Lab=Lab collect   08/24/23 0808   08/25/23 0500  CBC with Differential/Platelet  Daily,   R     Question:  Specimen collection method  Answer:  Lab=Lab collect   08/24/23 0808   08/24/23 0500  Magnesium  Daily,   R     Question:  Specimen collection method  Answer:  Lab=Lab collect   08/23/23 1351   08/24/23 0500  Phosphorus  Daily,   R     Question:  Specimen collection method  Answer:  Lab=Lab collect   08/23/23 1351   08/24/23 0500  Potassium  Daily,   R     Question:  Specimen collection method  Answer:  Lab=Lab collect   08/23/23 1351   08/16/23 0500  Creatinine, serum  (enoxaparin (LOVENOX)    CrCl >/= 30 ml/min)  Weekly,   R     Comments: while on enoxaparin therapy    08/09/23 1801           Total time spent in review of labs and imaging, patient evaluation, formulation of plan, documentation and communication with family: 45 minutes  Signed, Lorin Glass, MD [facility] 08/24/2023

## 2023-08-24 NOTE — Progress Notes (Signed)
 Nutrition Follow-up  DOCUMENTATION CODES:   Severe malnutrition in context of chronic illness  INTERVENTION:  Cont. Jevity 1.5 @ 50 mL/hr (goal) Prosource TF20 60mL daily 200 mL FWF Q6h  Provides 1880 kcal, 96 gm protein, 912 ml free water daily (1712 ml water daily TF + FWF)  Continue DYS 1 diet, nectar thick liquids with feeding assistance.  Mighty Shake TID with meals, each supplement provides 330 kcals and 9 grams of protein  Magic cup TID with meals, each supplement provides 290 kcal and 9 grams of protein  MVI with minerals.  NUTRITION DIAGNOSIS:   Severe Malnutrition related to chronic illness as evidenced by severe fat depletion, severe muscle depletion. *ongoing  GOAL:   Patient will meet greater than or equal to 90% of their needs *meeting through EN  MONITOR:   PO intake, Supplement acceptance, Labs, I & O's, Weight trends  REASON FOR ASSESSMENT:   Consult Assessment of nutrition requirement/status  ASSESSMENT:   73 y.o. male presented to the ED with fall/syncope. PMH includes COPD, CAD s/p CABG, HTN, PAD, bladder cancer, and CHF. Pt admitted with acute respiratory failure 2/2 PNA, altered mental status, and AKI. 3/20 - 2 gram low sodium regular diet 3/24 - Dysphagia 1, thin liquids - Doing well meeting 81% of calorie and 96% protein needs 3/26 - Dysphagia 1, nectar thick liquids - Pt aspirated and liquids were thickened 4/2 - Cortrak to be placed due to fluctuating mental status and PO intake  Meal intakes still fluctuating. Breakfast this morning 50% and lunch 15% completion. Pt found sleeping at RD visit with most of lunch tray untouched. EN running at goal rate of 58mL/hr. Sodium was high but improved with TF's and D5.    Diet Order:   Diet Order             DIET - DYS 1 Room service appropriate? No; Fluid consistency: Nectar Thick  Diet effective now                   EDUCATION NEEDS:   Education needs have been addressed  Skin:   Skin Assessment: Reviewed RN Assessment  Last BM:  08/23/23 type 6, medium  Height:   Ht Readings from Last 1 Encounters:  08/09/23 5\' 7"  (1.702 m)    Weight:   Wt Readings from Last 1 Encounters:  08/22/23 54.4 kg    Ideal Body Weight:  67.3 kg  BMI:  Body mass index is 18.78 kg/m.  Estimated Nutritional Needs:   Kcal:  1800-2000  Protein:  90-110 grams  Fluid:  >/= 1.8 L   Francisco Garcia, MPH, RD, LDN Clinical Dietitian Contact information can be found at [facility].

## 2023-08-24 NOTE — Progress Notes (Signed)
 This chaplain responded to PMT PA-Josseline consult for spiritual care. The chaplain understands the Pt. daughter is requesting children's resources for coping with serious illness. Family is not at the bedside at the time of the chaplain's visit. Next steps for a visit were discussed with Palliative Care. The chaplain will plan for a revisit on Friday.  Chaplain Stephanie Acre [phone]

## 2023-08-24 NOTE — TOC Progression Note (Addendum)
 Transition of Care [facility]) - Progression Note    Patient Details  Name: Manning Luna MRN: [id number] Date of Birth: 12-May-1951  [facility] of [facility] CM/SW Contact  Violett Hobbs A [place], LCSW Phone Number: 08/24/2023, 11:48 AM  Clinical Narrative:      Update 1338 CSW received contact from [facility] that pt's authorization request was denied due to MD review that pt does not appear medically stable for discharge. Stated CSW could provide updated clinicals and restart insurance authorization once pt is more medically stable. Provider notified.   TOC will continue to follow.   Auth remains pending for insurance for [employer].    TOC will continue to follow.       Expected Discharge Plan and Services                                               Social Determinants of Health (SDOH) Interventions SDOH Screenings   Food Insecurity: Patient Unable To Answer (08/09/2023)  Housing: Patient Unable To Answer (08/09/2023)  Transportation Needs: Patient Unable To Answer (08/09/2023)  Utilities: Patient Unable To Answer (08/09/2023)  Tobacco Use: Medium Risk (08/09/2023)    Readmission Risk Interventions     No data to display

## 2023-08-24 NOTE — Plan of Care (Signed)
  Problem: Clinical Measurements: Goal: Ability to maintain clinical measurements within normal limits will improve Outcome: Progressing Goal: Will remain free from infection Outcome: Progressing Goal: Diagnostic test results will improve Outcome: Progressing Goal: Respiratory complications will improve Outcome: Progressing Goal: Cardiovascular complication will be avoided Outcome: Progressing   Problem: Nutrition: Goal: Adequate nutrition will be maintained Outcome: Progressing   Problem: Coping: Goal: Level of anxiety will decrease Outcome: Progressing   Problem: Pain Managment: Goal: General experience of comfort will improve and/or be controlled Outcome: Progressing   Problem: Safety: Goal: Ability to remain free from injury will improve Outcome: Progressing   Problem: Skin Integrity: Goal: Risk for impaired skin integrity will decrease Outcome: Progressing   Problem: Safety: Goal: Non-violent Restraint(s) Outcome: Progressing

## 2023-08-24 NOTE — Progress Notes (Signed)
 Daily Progress Note   Patient Name: Francisco Garcia       Date: 08/24/2023 DOB: 1950/11/12  Age: 73 y.o. MRN#: [id number] Attending Physician: Lorin Glass, MD Primary Care Physician: [name], MD Admit Date: 08/09/2023  Reason for Consultation/Follow-up: Establishing goals of care  Subjective: Medical records reviewed including progress notes, labs, imaging. Patient assessed at the bedside.  He is disheveled, moaned in response to my attempts to speak with him.  No visitors present.  Called patient's son [name] to provide palliative support and for ongoing goals of care discussions.  We reviewed our conversation yesterday and I provided updates for today.  Unfortunately, patient has not made much progress since then, despite artificial nutrition and fluids.  Patient's son understands that it is very likely patient will ultimately need hospice.  He would prefer to be here in person when this is discussed and decided on.  He would like to speak with a palliative provider when he is back in town on Saturday around 11am.  We discussed the hospice referral process, eligibility criteria for hospice facility, and appropriateness in light of patient's acute and chronic illnesses.  The natural disease trajectory and expectations at EOL were discussed.  Questions and concerns addressed. PMT will continue to support holistically.   Length of Stay: 15   Physical Exam Vitals and nursing note reviewed.  Constitutional:      General: He is not in acute distress.    Appearance: He is ill-appearing.  Cardiovascular:     Rate and Rhythm: Normal rate.  Pulmonary:     Effort: Pulmonary effort is normal. No respiratory distress.  Neurological:     Mental Status: He is lethargic.  Psychiatric:        Cognition and Memory: Cognition is impaired.             Vital Signs: BP 118/68   Pulse 78   Temp 98.1 F (36.7 C) (Axillary)   Resp 18   Ht 5\' 7"  (1.702 m)   Wt 54.4 kg   SpO2 90%   BMI 18.78 kg/m  SpO2: SpO2: 90 % O2 Device: O2 Device: Room Air O2 Flow Rate: O2 Flow Rate (L/min): 3 L/min      Palliative Assessment/Data: 30% (on tube feeds)   Palliative Care Assessment & Plan   Patient Profile: 73 y.o. male  with past medical history of COPD, CAD status post CABG, HTN, PAD, MI, bladder cancer, paroxysmal A-fib, TIA, carotid artery stenosis, memory impairment admitted on 08/09/2023 with a fall/syncope, altered mental status.    Workup in the ED showed creatinine was slightly elevated, had lactic acidosis and rhabdomyolysis. Chest x-ray revealed new heterogenous opacity overlying the medial aspect of the right upper lung zone concerning for consolidation. He was admitted for acute hypoxic respiratory failure with hypoxia secondary to community-acquired pneumonia and COPD exacerbation, AKI, also found to have acute systolic CHF with EF now 30-25% (was 60% in 2023). He has had agitation, sundowning, and issues with pain during this hospitalization.   PMT has been consulted to assist with goals of care conversation.  Assessment: Goals of care conversation Dementia Acute metabolic encephalopathy AKI, improving Severe protein calorie malnutrition  Recommendations/Plan: Continue DNR/DNI  Continue current care plan.  Family would like to continue treatment for another 48 hours  Tentative family meeting scheduled for Saturday 4/5 around 11 AM for ongoing goals of care discussions Psychosocial and emotional support provided PMT will continue to follow   Prognosis: Very poor  Discharge Planning: To Be Determined  Care plan was discussed with patient's son, chaplain Stephanie Acre   MDM high         Hind Chesler Jeni Salles, PA-C  Palliative Medicine Team Team phone # [phone]  Thank you for allowing the Palliative  Medicine Team to assist in the care of this patient. Please utilize secure chat with additional questions, if there is no response within 30 minutes please call the above phone number.  Palliative Medicine Team providers are available by phone from 7am to 7pm daily and can be reached through the team cell phone.  Should this patient require assistance outside of these hours, please call the patient's attending physician.

## 2023-08-24 NOTE — Progress Notes (Signed)
 Physical Therapy Treatment Patient Details Name: Francisco Garcia MRN: [id number] DOB: 18-Oct-1950 Today's Date: 08/24/2023   History of Present Illness 73 year old male presented to ER 3/19 with fall, syncope, Acute respiratory failure with hypoxia, CAP (community acquired pneumonia), COPD with acute exacerbation (HCC), Acute CHF (congestive heart failure), Lactic acidosis, fall with mild rhabdomyolysis. PMHx: COPD, CAD status post CABG, HTN, PAD, CA bladder.    PT Comments  Pt received in supine, lethargic, not opening eyes to command but making sounds that are unintelligible, pt curled in fetal position. Pt assisted to reposition in supine and PTA requested nursing staff order him Prevalon boots for pressure relief and pt safety, boots donned after session. Pt needing PROM for all aspects of repositioning and PROM with pt appearing more relaxed/guarding less at end of session. PTA updated board to ensure staff roll him q2H for pressure relief. Pt will continue to benefit from skilled rehab in a post acute setting to maximize functional gains once he becomes more alert before returning home. Pt due for goal update next session, PT notified of pt functional decline and he would benefit from OT consult given he appears to live alone or with minimal family PRN assist at baseline.     If plan is discharge home, recommend the following: Two people to help with walking and/or transfers;A lot of help with bathing/dressing/bathroom;Assistance with cooking/housework;Direct supervision/assist for medications management;Direct supervision/assist for financial management;Assist for transportation;Supervision due to cognitive status   Can travel by private vehicle     No  Equipment Recommendations  None recommended by PT;Other (comment) (currently at wheelchair/hoyer/hospital bed level, TBD pending progress)    Recommendations for Other Services OT consult;Other (comment) (further cognitive work-up; unclear  PLOF but he appears to live alone?)     Precautions / Restrictions Precautions Precautions: Fall Recall of Precautions/Restrictions: Impaired Precaution/Restrictions Comments: monitor O2; mitts; 2-pt (UE) restraints; cortrak; prevalon boots now Restrictions Weight Bearing Restrictions Per Provider Order: No     Mobility  Bed Mobility Overal bed mobility: Needs Assistance Bed Mobility: Rolling Rolling: Total assist         General bed mobility comments: Rolling for pressure relief and repositioning to L/R sides.    Transfers                   General transfer comment: defer, pt too lethargic to follow commands    Ambulation/Gait                   Stairs             Wheelchair Mobility     Tilt Bed    Modified Rankin (Stroke Patients Only)       Balance       Sitting balance - Comments: unsafe to attempt today; lethargic                                    Communication Communication Communication: Impaired Factors Affecting Communication: Difficulty expressing self;Reduced clarity of speech;Other (comment) (dementia and lethargy)  Cognition Arousal: Lethargic, Obtunded Behavior During Therapy: Impulsive   PT - Cognitive impairments: No family/caregiver present to determine baseline, Difficult to assess, Initiation, Sequencing, Problem solving, Safety/Judgement, Attention, Awareness, Orientation, Memory Difficult to assess due to: Impaired communication, Level of arousal Orientation impairments: Place, Time, Situation, Person                   PT -  Cognition Comments: Difficulty assessing pt orientation due to lethargy but pt not able to follow commands this date and was opening eyes and following some commands earlier in the week. Per RN pt did have Ativan at ~9pm previous evening Following commands: Impaired Following commands impaired: Follows one step commands inconsistently    Cueing Cueing Techniques:  Verbal cues, Gestural cues, Tactile cues  Exercises Other Exercises Other Exercises: pt not following instructions, PROM for all BLE motions. worked on BLE knee/hip extension in supine and heel cord stretch a few reps ea    General Comments General comments (skin integrity, edema, etc.): pt in fetal position with UE restraints and mitts on when PTA arrived with his feet tucked under his bottom on his side, after repositioning in supine and x2 warm blankets placed, pt able to maintain BLE and BUE extension and less guarding, possibly he was just cold; Prevalon boots donned and pillow to L side of his head to prevent him from pushing head against L bed rail and under L hip/lower back to offload pressure; pt tending to lean toward his L side this date per RN; HOB locked at >30 degrees as he now has cortrak      Pertinent Vitals/Pain Pain Assessment Pain Assessment: PAINAD Breathing: normal Negative Vocalization: repeated troubled calling out, loud moaning/groaning, crying Facial Expression: facial grimacing Body Language: tense, distressed pacing, fidgeting Consolability: distracted or reassured by voice/touch PAINAD Score: 6 Pain Location: pt guarding/tending to flex knees and arms in fetal position, but also lethargic so difficult to tell if he is just cold, anxious or in pain; x2 warm blankets obtained and pt positioned for comfort and pressure relief Pain Descriptors / Indicators: Grimacing, Guarding, Moaning Pain Intervention(s): Limited activity within patient's tolerance, Monitored during session, Repositioned, Other (comment) (x2 warm blankets)    Home Living                          Prior Function            PT Goals (current goals can now be found in the care plan section) Acute Rehab PT Goals Patient Stated Goal: none stated PT Goal Formulation: Patient unable to participate in goal setting Time For Goal Achievement: 08/25/23 Progress towards PT goals: Not  progressing toward goals - comment;PT to reassess next treatment (functional decline over the past week)    Frequency    Min 2X/week      PT Plan      Co-evaluation              AM-PAC PT "6 Clicks" Mobility   Outcome Measure  Help needed turning from your back to your side while in a flat bed without using bedrails?: Total Help needed moving from lying on your back to sitting on the side of a flat bed without using bedrails?: Total Help needed moving to and from a bed to a chair (including a wheelchair)?: Total Help needed standing up from a chair using your arms (e.g., wheelchair or bedside chair)?: Total Help needed to walk in hospital room?: Total Help needed climbing 3-5 steps with a railing? : Total 6 Click Score: 6    End of Session   Activity Tolerance: Patient limited by lethargy Patient left: in bed;with call bell/phone within reach;with bed alarm set;Other (comment) (prevlon boots donned; soft touch call bell next to him in bed; blinds open) Nurse Communication: Mobility status;Need for lift equipment;Other (comment);Precautions (prevalon boots; roll q2H) PT  Visit Diagnosis: Repeated falls (R29.6);Muscle weakness (generalized) (M62.81);History of falling (Z91.81);Difficulty in walking, not elsewhere classified (R26.2);Other symptoms and signs involving the nervous system (R29.898)     Time: 1655 (back in 1740; out 1743 to place prevalon boots)-1705 PT Time Calculation (min) (ACUTE ONLY): 10 min +3 mins  Charges:    $Therapeutic Activity: 8-22 mins PT General Charges $$ ACUTE PT VISIT: 1 Visit                     Nelda Luckey P., PTA Acute Rehabilitation Services Secure Chat Preferred 9a-5:30pm Office: [phone]    Angus Palms 08/24/2023, 6:37 PM

## 2023-08-25 DIAGNOSIS — J9601 Acute respiratory failure with hypoxia: Secondary | ICD-10-CM | POA: Diagnosis not present

## 2023-08-25 LAB — CBC WITH DIFFERENTIAL/PLATELET
Abs Immature Granulocytes: 0.17 10*3/uL — ABNORMAL HIGH (ref 0.00–0.07)
Basophils Absolute: 0 10*3/uL (ref 0.0–0.1)
Basophils Relative: 0 %
Eosinophils Absolute: 0 10*3/uL (ref 0.0–0.5)
Eosinophils Relative: 0 %
HCT: 51.7 % (ref 39.0–52.0)
Hemoglobin: 17.1 g/dL — ABNORMAL HIGH (ref 13.0–17.0)
Immature Granulocytes: 1 %
Lymphocytes Relative: 8 %
Lymphs Abs: 1.7 10*3/uL (ref 0.7–4.0)
MCH: 35.8 pg — ABNORMAL HIGH (ref 26.0–34.0)
MCHC: 33.1 g/dL (ref 30.0–36.0)
MCV: 108.4 fL — ABNORMAL HIGH (ref 80.0–100.0)
Monocytes Absolute: 1.8 10*3/uL — ABNORMAL HIGH (ref 0.1–1.0)
Monocytes Relative: 9 %
Neutro Abs: 17.1 10*3/uL — ABNORMAL HIGH (ref 1.7–7.7)
Neutrophils Relative %: 82 %
Platelets: 135 10*3/uL — ABNORMAL LOW (ref 150–400)
RBC: 4.77 MIL/uL (ref 4.22–5.81)
RDW: 13.1 % (ref 11.5–15.5)
WBC: 20.7 10*3/uL — ABNORMAL HIGH (ref 4.0–10.5)
nRBC: 0 % (ref 0.0–0.2)

## 2023-08-25 LAB — PHOSPHORUS: Phosphorus: 2.7 mg/dL (ref 2.5–4.6)

## 2023-08-25 LAB — BASIC METABOLIC PANEL WITH GFR
Anion gap: 7 (ref 5–15)
BUN: 51 mg/dL — ABNORMAL HIGH (ref 8–23)
CO2: 27 mmol/L (ref 22–32)
Calcium: 9.9 mg/dL (ref 8.9–10.3)
Chloride: 108 mmol/L (ref 98–111)
Creatinine, Ser: 1.81 mg/dL — ABNORMAL HIGH (ref 0.61–1.24)
GFR, Estimated: 39 mL/min — ABNORMAL LOW (ref >60)
Glucose, Bld: 122 mg/dL — ABNORMAL HIGH (ref 70–99)
Potassium: 4 mmol/L (ref 3.5–5.1)
Sodium: 142 mmol/L (ref 135–145)

## 2023-08-25 LAB — GLUCOSE, CAPILLARY
Glucose-Capillary: 108 mg/dL — ABNORMAL HIGH (ref 70–99)
Glucose-Capillary: 144 mg/dL — ABNORMAL HIGH (ref 70–99)
Glucose-Capillary: 153 mg/dL — ABNORMAL HIGH (ref 70–99)
Glucose-Capillary: 164 mg/dL — ABNORMAL HIGH (ref 70–99)
Glucose-Capillary: 177 mg/dL — ABNORMAL HIGH (ref 70–99)

## 2023-08-25 LAB — MAGNESIUM: Magnesium: 2.8 mg/dL — ABNORMAL HIGH (ref 1.7–2.4)

## 2023-08-25 MED ORDER — QUETIAPINE FUMARATE 25 MG PO TABS: 25.0000 mg | ORAL_TABLET | Freq: Every evening | ORAL | Status: DC | PRN

## 2023-08-25 MED ORDER — DEXTROSE-SODIUM CHLORIDE 5-0.9 % IV SOLN: INTRAVENOUS | Status: AC

## 2023-08-25 NOTE — Progress Notes (Signed)
 OT Cancellation Note  Patient Details Name: Francisco Garcia MRN: [id number] DOB: [birth year]   Cancelled Treatment:    Reason Eval/Treat Not Completed: Medical issues which prohibited therapy;Fatigue/lethargy limiting ability to participate Per RN and SLP, patient is not managing his secretions, is not following commands, and not appropriate for OT evaluation this date. RN noted that there is plan for a meeting with palliative for goals of care, OT will follow back to complete evaluation as medically appropriate.   Pollyann Glen E. Spencer Peterkin, OTR/L Acute Rehabilitation Services [phone]   Cherlyn Cushing 08/25/2023, 9:17 AM

## 2023-08-25 NOTE — Progress Notes (Signed)
 PROGRESS NOTE  Francisco Garcia  DOB: December 10, 1950  PCP: [name], MD ZOX:[id number]  DOA: 08/09/2023  LOS: 16 days  Hospital Day: 17  Brief narrative: Francisco Garcia is a 73 y.o. male with PMH significant for HTN, CAD/CABG, paroxysmal A-fib, TIA, carotid artery stenosis, PAD, COPD, memory impairment, bladder cancer. 3/19, patient presented to the ED with a fall, altered mental status  No in the ED, he was short of breath, had diffuse bilateral rhonchi Workup showed lactic acidosis of 3, CK level elevated 750 Chest x-ray revealed new heterogenous opacity overlying the medial aspect of the right upper lung zone concerning for consolidation.  CT head C-spine revealed no CT evidence of acute intracranial abnormality, no acute fracture or traumatic malalignment of C-spine.   Interlobular septal thickening which could reflect pulmonary edema, emphysema.   Left shoulder x-ray showed chronic longstanding rotator cuff tear, mild glenohumeral osteoarthritis.  MRI brain revealed no acute intracranial abnormality.  Age-related simple atrophy with mild chronic small vessel ischemic disease reported.   Right upper quadrant ultrasound showed subtle liver surface nodularity concerning for cirrhosis, no discrete focal liver lesion.  Small amount of gallbladder sludge   Admitted to [facility]  Subjective: Patient was seen and examined this morning. Lying down in bed.  Moans on touch. No family at bedside. No significant improvement in last several days Pending family meeting today with palliative WBC uptrending.  Creatinine uptrending  Assessment and plan: Acute metabolic encephalopathy Underlying dementia MRI brain without any acute intracranial abnormality but age-related atrophy and chronic ischemic changes.  Patient probably has underlying dementia. Extensive workup done to identify any potential cause of acute impairment in mental status. Normal ammonia level, normal TSH, folate, vitamin  B12 Currently on Seroquel 25 mg nightly.   Palliative care consultation appreciated. On core track feeding since 4/2. No essential change in mental status in the last several days.  He mostly is somnolent and moans on touch.  Unable to have any meaningful conversation.  Hypernatremia Sodium level better today with correction of dehydration with D5W and tube feeding Recent Labs  Lab 08/19/23 0236 08/21/23 0459 08/22/23 1305 08/23/23 0609 08/24/23 0727 08/25/23 0638  NA 151* 147* 146* 150* 142 142   Acute respiratory failure with hypoxia Community-acquired pneumonia COPD exacerbation Initial chest x-ray imaging suggestive of right upper lobe consolidation. WBC and procalcitonin level were elevated. Patient completed empiric course of antibiotics WBC count uptrending.  No fever.  Suspect atelectasis.  Continue to monitor Continue bronchodilators, Robitussin as needed Recent Labs  Lab 08/21/23 0459 08/23/23 0609 08/25/23 0638  WBC 13.3* 16.1* 20.7*   Acute systolic CHF Echo showed newly reduced EF 30 to 35% compared to 60% in 2023 Cardiology consultation was obtained. Currently on Coreg 6.25 mg twice daily, nifedipine 60 mg daily  AKI Creatinine worsening today. Poor oral intake restart IV fluid. Recent Labs    08/15/23 0940 08/16/23 0233 08/17/23 0312 08/18/23 0425 08/19/23 0236 08/21/23 0459 08/22/23 1305 08/23/23 0609 08/24/23 0727 08/25/23 0638  BUN 67* 70* 47* 46* 35* 40* 45* 39* 32* 51*  CREATININE 1.53* 1.48* 1.31* 1.20 1.05 1.36* 1.51* 1.34* 1.31* 1.81*  CO2 30 32 31 30 30 30  19* 29 25 27     Severe malnutrition Due to poor intake Dietitian recommendation appreciated  Impaired mobility  PT eval obtained. SNF recommended.   Goals of care   Code Status: Limited: Do not attempt resuscitation (DNR) -DNR-LIMITED -Do Not Intubate/DNI     DVT prophylaxis:  enoxaparin (LOVENOX) injection 40  mg Start: 08/09/23 1815 SCDs Start: 08/09/23 1801    Antimicrobials: Completed the course Fluid: D5 NS at 75 mL/h  Consultants: Palliative care consulted Family Communication: Family not at bedside today  Status: Inpatient Level of care:  Telemetry Medical   Patient is from: Home Needs to continue in-hospital care: Waxing and waning mental status Anticipated d/c to: Unclear at this time.  Mental status not stable for discharge   Diet:  Diet Order             Diet NPO time specified  Diet effective now                   Scheduled Meds:  arformoterol  15 mcg Nebulization BID   budesonide (PULMICORT) nebulizer solution  0.5 mg Nebulization BID   carvedilol  6.25 mg Oral BID WC   enoxaparin (LOVENOX) injection  40 mg Subcutaneous Q24H   feeding supplement (PROSource TF20)  60 mL Per Tube Daily   free water  200 mL Per Tube Q6H   guaiFENesin  15 mL Oral Q6H   multivitamin with minerals  1 tablet Per Tube Daily   NIFEdipine  60 mg Oral Daily   sodium chloride flush  10-40 mL Intracatheter Q12H   sodium chloride flush  10-40 mL Intracatheter Q12H   sodium chloride flush  3 mL Intravenous Q12H   thiamine  100 mg Oral Daily    PRN meds: acetaminophen **OR** acetaminophen, bisacodyl, glucagon (human recombinant), guaiFENesin, hydrALAZINE, levalbuterol **AND** [DISCONTINUED] ipratropium, LORazepam, metoprolol tartrate, nicotine, ondansetron **OR** ondansetron (ZOFRAN) IV, QUEtiapine, senna-docusate, sodium chloride flush, sodium chloride flush, sodium chloride flush, sodium phosphate   Infusions:   dextrose 5 % and 0.9 % NaCl 75 mL/hr at 08/25/23 1142   feeding supplement (JEVITY 1.5 CAL/FIBER) 50 mL/hr at 08/25/23 0243    Antimicrobials: Anti-infectives (From admission, onward)    Start     Dose/Rate Route Frequency Ordered Stop   08/11/23 1000  azithromycin (ZITHROMAX) tablet 250 mg        250 mg Oral Daily 08/11/23 0852 08/13/23 1006   08/10/23 1700  azithromycin (ZITHROMAX) 250 mg in dextrose 5 % 125 mL IVPB  Status:   Discontinued        250 mg 127.5 mL/hr over 60 Minutes Intravenous Every 24 hours 08/09/23 1759 08/11/23 0852   08/10/23 1000  cefTRIAXone (ROCEPHIN) 1 g in sodium chloride 0.9 % 100 mL IVPB        1 g 200 mL/hr over 30 Minutes Intravenous Every 24 hours 08/09/23 1759 08/13/23 1043   08/09/23 1515  cefTRIAXone (ROCEPHIN) 1 g in sodium chloride 0.9 % 100 mL IVPB        1 g 200 mL/hr over 30 Minutes Intravenous  Once 08/09/23 1504 08/09/23 1622   08/09/23 1515  azithromycin (ZITHROMAX) 500 mg in sodium chloride 0.9 % 250 mL IVPB        500 mg 250 mL/hr over 60 Minutes Intravenous  Once 08/09/23 1504 08/09/23 1803       Objective: Vitals:   08/25/23 0450 08/25/23 0800  BP: 101/68 100/63  Pulse: 81 74  Resp: 19 18  Temp: (!) 97.5 F (36.4 C) 98.4 F (36.9 C)  SpO2: 92% 91%    Intake/Output Summary (Last 24 hours) at 08/25/2023 1409 Last data filed at 08/25/2023 0243 Gross per 24 hour  Intake 1372.83 ml  Output 800 ml  Net 572.83 ml   Filed Weights   08/19/23 0500 08/22/23 0300 08/25/23  0450  Weight: 57.1 kg 54.4 kg 54.4 kg   Weight change:  Body mass index is 18.78 kg/m.   Physical Exam: General exam: Pleasant, elderly male.  Thin built.  Curled up  Skin: No rashes, lesions or ulcers.  Looks dry overall HEENT: Atraumatic, normocephalic, no obvious bleeding Lungs: Clear to auscultation bilaterally,  CVS: S1, S2, no murmur,   GI/Abd: Soft, nontender, nondistended, bowel sound present,   CNS: Somnolent, moans on sternal rub.  Unable to have verbalize concern or have a meaningful conversation Psychiatry: Unable to assess Extremities: No pedal edema, no calf tenderness,   Data Review: I have personally reviewed the laboratory data and studies available.  F/u labs ordered Unresulted Labs (From admission, onward)     Start     Ordered   08/25/23 0500  Basic metabolic panel with GFR  Daily,   R     Question:  Specimen collection method  Answer:  Lab=Lab collect    08/24/23 0808   08/25/23 0500  CBC with Differential/Platelet  Daily,   R     Question:  Specimen collection method  Answer:  Lab=Lab collect   08/24/23 0808   08/24/23 0500  Magnesium  Daily,   R     Question:  Specimen collection method  Answer:  Lab=Lab collect   08/23/23 1351   08/24/23 0500  Phosphorus  Daily,   R     Question:  Specimen collection method  Answer:  Lab=Lab collect   08/23/23 1351   08/24/23 0500  Potassium  Daily,   R     Question:  Specimen collection method  Answer:  Lab=Lab collect   08/23/23 1351   08/16/23 0500  Creatinine, serum  (enoxaparin (LOVENOX)    CrCl >/= 30 ml/min)  Weekly,   R     Comments: while on enoxaparin therapy    08/09/23 1801           Total time spent in review of labs and imaging, patient evaluation, formulation of plan, documentation and communication with family: 45 minutes  Signed, Lorin Glass, MD [facility] 08/25/2023

## 2023-08-25 NOTE — Progress Notes (Signed)
 Routine line care: midline sluggish to flush, noted kink in line during dressing change. Midline removed. Will await IV med need before reinserting line.

## 2023-08-25 NOTE — Progress Notes (Addendum)
 Speech Language Pathology Treatment: Dysphagia  Patient Details Name: Francisco Garcia MRN: [id number] DOB: 1951-05-08 Today's Date: 08/25/2023 Time: 4098-1191 SLP Time Calculation (min) (ACUTE ONLY): 11 min  Assessment / Plan / Recommendation Clinical Impression  Pt seen for ongoing dysphagia management. Unfortunately his swallow function appears to be declining.  SLP provided oral care prior to initiation of PO trials.  Xerostomia noted with orange/brown appearance to tongue.  Small amount of chalky white substance removed from frontal sulcus.  RN confirms pt has not been receiving PO meds. Pt with cortrak in place.  Pt noted to moan occasionally with wet vocal quality noted.  Suspect poor secretion management.  Pt's head had to be held at midline position to prevent anterior loss of bolus trials. With spoonful of room temperature nectar thick liquid there was no oral response and and bolus was suctioned from oral cavity.  With spoon of cold puree pt did exhibit some labial movement, but there was no oral manipulation of bolus and puree was suctioned from oral cavity.  Pt with wet cough noted, SLP removed secretions from over base of tongue with suction catheter.  Family meeting with palliative care planned for next date.  Recommend pt remain NPO with alternate means of nutrition, hydration, and medication, pending that discussion.   If pt is awake/alert and requesting POs, pt may have a very small amount of unthickened water or single ice chips by spoon after good oral care, with upright positioning and 1:1 assistance    HPI HPI: Patient is a 73 y.o. 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 He presented to the hospital on 08/09/23 for evaluation following fall/syncope, Barrett's esophagus, GERD. SLP swallow evaluation ordered after RN observed him to get choked and turn purple after a bite of food and sip of juice. Per RD, patient  reportedly did well with eating previous date.      SLP Plan  Continue with current plan of care      Recommendations for follow up therapy are one component of a multi-disciplinary discharge planning process, led by the attending physician.  Recommendations may be updated based on patient status, additional functional criteria and insurance authorization.    Recommendations  Diet recommendations: NPO Medication Administration: Via alternative means                  Oral care QID;Oral care prior to ice chip/H20   Frequent or constant Supervision/Assistance Dysphagia, oropharyngeal phase (R13.12)     Continue with current plan of care     Kerrie Pleasure, MA, CCC-SLP Acute Rehabilitation Services Office: [phone] 08/25/2023, 8:57 AM

## 2023-08-25 NOTE — Plan of Care (Signed)
  Problem: Clinical Measurements: Goal: Will remain free from infection Outcome: Progressing   Problem: Pain Managment: Goal: General experience of comfort will improve and/or be controlled Outcome: Progressing   Problem: Safety: Goal: Ability to remain free from injury will improve Outcome: Progressing   Problem: Safety: Goal: Non-violent Restraint(s) Outcome: Progressing

## 2023-08-25 NOTE — Plan of Care (Signed)
  Problem: Clinical Measurements: Goal: Will remain free from infection Outcome: Progressing Goal: Respiratory complications will improve Outcome: Progressing   Problem: Safety: Goal: Ability to remain free from injury will improve Outcome: Progressing   Problem: Skin Integrity: Goal: Risk for impaired skin integrity will decrease Outcome: Progressing   Problem: Safety: Goal: Non-violent Restraint(s) Outcome: Progressing

## 2023-08-26 DIAGNOSIS — Z7189 Other specified counseling: Secondary | ICD-10-CM | POA: Diagnosis not present

## 2023-08-26 DIAGNOSIS — Z515 Encounter for palliative care: Secondary | ICD-10-CM | POA: Diagnosis not present

## 2023-08-26 DIAGNOSIS — G9341 Metabolic encephalopathy: Secondary | ICD-10-CM | POA: Diagnosis not present

## 2023-08-26 DIAGNOSIS — J9601 Acute respiratory failure with hypoxia: Secondary | ICD-10-CM | POA: Diagnosis not present

## 2023-08-26 LAB — GLUCOSE, CAPILLARY
Glucose-Capillary: 158 mg/dL — ABNORMAL HIGH (ref 70–99)
Glucose-Capillary: 168 mg/dL — ABNORMAL HIGH (ref 70–99)
Glucose-Capillary: 170 mg/dL — ABNORMAL HIGH (ref 70–99)
Glucose-Capillary: 174 mg/dL — ABNORMAL HIGH (ref 70–99)
Glucose-Capillary: 194 mg/dL — ABNORMAL HIGH (ref 70–99)

## 2023-08-26 MED ORDER — HALOPERIDOL LACTATE 5 MG/ML IJ SOLN: 0.5000 mg | INTRAMUSCULAR | Status: DC | PRN

## 2023-08-26 MED ORDER — MORPHINE SULFATE (CONCENTRATE) 10 MG /0.5 ML PO SOLN
5.0000 mg | ORAL | Status: DC | PRN
Start: 2023-08-26 — End: 2023-08-28

## 2023-08-26 MED ORDER — BIOTENE DRY MOUTH MT LIQD
15.0000 mL | Freq: Two times a day (BID) | OROMUCOSAL | Status: DC
  Administered 2023-08-27 (×3): 15 mL via TOPICAL

## 2023-08-26 MED ORDER — MORPHINE SULFATE (PF) 2 MG/ML IV SOLN
1.0000 mg | INTRAVENOUS | Status: DC | PRN
  Administered 2023-08-26 – 2023-08-27 (×3): 2 mg via INTRAVENOUS
  Administered 2023-08-27: 4 mg via INTRAVENOUS
  Filled 2023-08-26: qty 2
  Filled 2023-08-26 (×3): qty 1

## 2023-08-26 MED ORDER — POLYVINYL ALCOHOL 1.4 % OP SOLN
1.0000 [drp] | Freq: Four times a day (QID) | OPHTHALMIC | Status: DC | PRN
  Filled 2023-08-26: qty 15

## 2023-08-26 MED ORDER — GUAIFENESIN 100 MG/5ML PO LIQD
15.0000 mL | Freq: Four times a day (QID) | ORAL | Status: DC
  Administered 2023-08-26: 15 mL
  Filled 2023-08-26: qty 15

## 2023-08-26 MED ORDER — HALOPERIDOL 1 MG PO TABS: 0.5000 mg | ORAL_TABLET | ORAL | Status: DC | PRN

## 2023-08-26 MED ORDER — ENOXAPARIN SODIUM 30 MG/0.3ML IJ SOSY
30.0000 mg | PREFILLED_SYRINGE | INTRAMUSCULAR | Status: DC
Start: 2023-08-26 — End: 2023-08-26

## 2023-08-26 MED ORDER — GLYCOPYRROLATE 0.2 MG/ML IJ SOLN: 0.2000 mg | INTRAMUSCULAR | Status: DC | PRN

## 2023-08-26 MED ORDER — HALOPERIDOL LACTATE 2 MG/ML PO CONC
0.5000 mg | ORAL | Status: DC | PRN
  Filled 2023-08-26: qty 5

## 2023-08-26 MED ORDER — DEXTROSE-SODIUM CHLORIDE 5-0.9 % IV SOLN: INTRAVENOUS | Status: DC

## 2023-08-26 MED ORDER — CARVEDILOL 6.25 MG PO TABS
6.2500 mg | ORAL_TABLET | Freq: Two times a day (BID) | ORAL | Status: DC
  Administered 2023-08-26: 6.25 mg
  Filled 2023-08-26: qty 1

## 2023-08-26 MED ORDER — GLYCOPYRROLATE 0.2 MG/ML IJ SOLN
0.2000 mg | INTRAMUSCULAR | Status: DC | PRN
  Administered 2023-08-26: 0.2 mg via INTRAVENOUS
  Filled 2023-08-26: qty 1

## 2023-08-26 MED ORDER — GLYCOPYRROLATE 1 MG PO TABS
1.0000 mg | ORAL_TABLET | ORAL | Status: DC | PRN
  Filled 2023-08-26: qty 1

## 2023-08-26 NOTE — Progress Notes (Signed)
 OT Cancellation Note  Patient Details Name: Francisco Garcia MRN: [id number] DOB: [birth year]   Cancelled Treatment:    Reason Eval/Treat Not Completed: Other (comment) (Checked with Pt RN, pt/family transitioning to comfort care. No formal order placed yet. Will defer therapy at this time and once order placed, will DC from caseload if needed.)  08/26/2023  AB, OTR/L  Acute Rehabilitation Services  Office: [phone]   Tristan Schroeder 08/26/2023, 4:47 PM

## 2023-08-26 NOTE — Progress Notes (Signed)
 Palliative Medicine Progress Note   Patient Name: Francisco Garcia       Date: 08/26/2023 DOB: 12-Jan-1951  Age: 73 y.o. MRN#: [id number] Attending Physician: Lorin Glass, MD Primary Care Physician: [name], MD Admit Date: 08/09/2023   HPI/Patient Profile: 73 y.o. male  with past medical history of COPD, CAD status post CABG, HTN, PAD, MI, bladder cancer, paroxysmal A-fib, TIA, carotid artery stenosis, memory impairment admitted on 08/09/2023 with a fall/syncope, altered mental status.    Workup in the ED showed creatinine was slightly elevated, had lactic acidosis and rhabdomyolysis. Chest x-ray revealed new heterogenous opacity overlying the medial aspect of the right upper lung zone concerning for consolidation. He was admitted for acute hypoxic respiratory failure with hypoxia secondary to community-acquired pneumonia and COPD exacerbation, AKI, also found to have acute systolic CHF with EF now 30-25% (was 60% in 2023). He has had agitation, sundowning, and issues with pain during this hospitalization.   PMT has been consulted to assist with goals of care conversation.   Subjective: Chart reviewed patient assessed at bedside.  He is lying in bed with eyes closed, lethargic, does not follow commands.  Appears restless and uncomfortable with facial grimacing.   Family Meeting: I met with daughter/Francisco Garcia and son/Francisco Garcia at bedside.  We reviewed patient's hospital course and current clinical condition.  Education offered on the limitations of medical interventions to prolong quality of life when the body fails to thrive.   We reviewed the concept of comfort care as de-escalating full scope medical interventions and allowing a natural course to occur. Discussed that the goal is comfort and  dignity rather than prolonging life.   Daughter expresses that she does not want her father to "suffer" and that it has been difficult to "see him like this".  She shares that he has previously expressed he would not want to have his life prolonged in a state of being dependent for all care.   Discussed transitioning to comfort care, and what that would look like--keeping him clean and dry, no labs, no artificial hydration or feeding, no antibiotics, minimizing of medications, and giving medication to alleviate pain and other symptoms as needed.   With further discussion, both son and daughter are agreeable to transition to comfort care at this time. They understand feeding tube will be removed. Natural trajectory  and expectations at EOL were discussed. We also discussed the option to transfer to inpatient hospice facility, as patient's prognosis was likely less than 2 weeks. Family requests facility in [facility].  Emotional support provided. Questions/concerns addressed.    Objective:  Physical Exam Vitals reviewed.  Constitutional:      General: He is not in acute distress.    Appearance: He is ill-appearing.  HENT:     Head:     Comments: Cortrak in place Pulmonary:     Effort: No respiratory distress.  Neurological:     Mental Status: He is lethargic.     Comments: Does not follow commands Non-verbal          Palliative Medicine Assessment & Plan   Assessment: Principal Problem:   Acute respiratory failure with hypoxia (HCC) Active Problems:   CAP (community acquired pneumonia)   Fall   Acute metabolic encephalopathy   COPD with acute exacerbation (HCC)   Lactic acidosis   Acute CHF (congestive heart failure) (HCC)   Hypertensive urgency   Protein-calorie malnutrition, severe    Recommendations/Plan: Transition to comfort care Discontinue labs, artificial feeding, IV fluid Remove NG feeding tube (cortrak) Discontinue restraints/mittens PRN medications are  available for symptom management at end-of-life Referral to hospice facility in [facility] is aware PMT will continue to follow  Symptom Management:  Morphine prn for pain or dyspnea Lorazepam (ATIVAN) prn for anxiety Haloperidol (HALDOL) prn for agitation  Glycopyrrolate (ROBINUL) for excessive secretions Ondansetron (ZOFRAN) prn for nausea Polyvinyl alcohol (LIQUIFILM TEARS) prn for dry eyes Antiseptic oral rinse (BIOTENE) twice daily for dry mouth  Code Status: DNR - comfort   Prognosis:  < 2 weeks   Care plan was discussed with Dr. Pola Corn, [facility], RN  Thank you for allowing the Palliative Medicine Team to assist in the care of this patient.   Time: 65 minutes  Detailed review of medical records (labs, imaging, vital signs), medically appropriate exam, discussed with treatment team, counseling and education to family, & staff, documenting clinical information, medication management, coordination of care.    Merry Proud, NP   Please contact Palliative Medicine Team phone at [phone] for questions and concerns.  For individual providers, please see AMION.

## 2023-08-26 NOTE — Progress Notes (Signed)
 PROGRESS NOTE  Francisco Garcia  DOB: 1951-02-22  PCP: [name], MD QQV:[id number]  DOA: 08/09/2023  LOS: 17 days  Hospital Day: 18  Brief narrative: Francisco Garcia is a 73 y.o. male with PMH significant for HTN, CAD/CABG, paroxysmal A-fib, TIA, carotid artery stenosis, PAD, COPD, memory impairment, bladder cancer. 3/19, patient presented to the ED with a fall, altered mental status  No in the ED, he was short of breath, had diffuse bilateral rhonchi Workup showed lactic acidosis of 3, CK level elevated 750 Chest x-ray revealed new heterogenous opacity overlying the medial aspect of the right upper lung zone concerning for consolidation.  CT head C-spine revealed no CT evidence of acute intracranial abnormality, no acute fracture or traumatic malalignment of C-spine.   Interlobular septal thickening which could reflect pulmonary edema, emphysema.   Left shoulder x-ray showed chronic longstanding rotator cuff tear, mild glenohumeral osteoarthritis.  MRI brain revealed no acute intracranial abnormality.  Age-related simple atrophy with mild chronic small vessel ischemic disease reported.   Right upper quadrant ultrasound showed subtle liver surface nodularity concerning for cirrhosis, no discrete focal liver lesion.  Small amount of gallbladder sludge   Admitted to [facility]  Subjective: Patient was seen and examined this morning. Lying down in bed.  Moans on touch. No family at bedside. No essential change in status. Spoke with family later on the phone  Assessment and plan: Acute metabolic encephalopathy Underlying dementia MRI brain without any acute intracranial abnormality but age-related atrophy and chronic ischemic changes.  Patient probably has underlying dementia. Extensive workup done to identify any potential cause of acute impairment in mental status. Normal ammonia level, normal TSH, folate, vitamin B12 Currently on Seroquel 25 mg nightly.   Palliative care  consultation appreciated. On core track feeding since 4/2. No essential change in mental status in the last several days.  He mostly is somnolent and moans on touch.  Unable to have any meaningful conversation.  Hypernatremia Sodium level better with correction of dehydration with IV fluid and tube feeding Recent Labs  Lab 08/21/23 0459 08/22/23 1305 08/23/23 0609 08/24/23 0727 08/25/23 0638  NA 147* 146* 150* 142 142   Acute respiratory failure with hypoxia Community-acquired pneumonia COPD exacerbation Initial chest x-ray imaging suggestive of right upper lobe consolidation. WBC and procalcitonin level were elevated. Patient completed empiric course of antibiotics WBC count uptrending.  No fever.  Suspect atelectasis.  Continue to monitor Continue bronchodilators, Robitussin as needed Repeat labs tomorrow. Recent Labs  Lab 08/21/23 0459 08/23/23 0609 08/25/23 0638  WBC 13.3* 16.1* 20.7*   Acute systolic CHF Echo showed newly reduced EF 30 to 35% compared to 60% in 2023 Cardiology consultation was obtained. Currently on Coreg 6.25 mg twice daily, nifedipine 60 mg daily  AKI Creatinine worsening today. Poor oral intake.  On IV hydration Repeat labs tomorrow Recent Labs    08/15/23 0940 08/16/23 0233 08/17/23 3329 08/18/23 0425 08/19/23 0236 08/21/23 0459 08/22/23 1305 08/23/23 0609 08/24/23 0727 08/25/23 0638  BUN 67* 70* 47* 46* 35* 40* 45* 39* 32* 51*  CREATININE 1.53* 1.48* 1.31* 1.20 1.05 1.36* 1.51* 1.34* 1.31* 1.81*  CO2 30 32 31 30 30 30  19* 29 25 27     Severe malnutrition Due to poor intake Dietitian recommendation appreciated  Acute urinary retention Having overflow incontinence. Given significantly altered mental status, will insert Foley catheter  Impaired mobility  PT eval obtained. SNF recommended.   Goals of care   Code Status: Limited: Do not attempt resuscitation (DNR) -DNR-LIMITED -  Do Not Intubate/DNI     DVT prophylaxis:   enoxaparin (LOVENOX) injection 30 mg Start: 08/26/23 2200 SCDs Start: 08/09/23 1801   Antimicrobials: Completed the course Fluid: D5 NS at 75 mL/h  Consultants: Palliative care consulted Family Communication: Family not at bedside today.  Called and updated patient's son this afternoon***  Status: Inpatient Level of care:  Telemetry Medical   Patient is from: Home Needs to continue in-hospital care: Waxing and waning mental status Anticipated d/c to: Unclear at this time.  Mental status not stable for discharge   Diet:  Diet Order             Diet NPO time specified  Diet effective now                   Scheduled Meds:  arformoterol  15 mcg Nebulization BID   budesonide (PULMICORT) nebulizer solution  0.5 mg Nebulization BID   carvedilol  6.25 mg Per Tube BID WC   enoxaparin (LOVENOX) injection  30 mg Subcutaneous Q24H   feeding supplement (PROSource TF20)  60 mL Per Tube Daily   free water  200 mL Per Tube Q6H   guaiFENesin  15 mL Per Tube Q6H   multivitamin with minerals  1 tablet Per Tube Daily   NIFEdipine  60 mg Oral Daily   sodium chloride flush  10-40 mL Intracatheter Q12H   sodium chloride flush  10-40 mL Intracatheter Q12H   sodium chloride flush  3 mL Intravenous Q12H   thiamine  100 mg Oral Daily    PRN meds: acetaminophen **OR** acetaminophen, bisacodyl, glucagon (human recombinant), guaiFENesin, hydrALAZINE, levalbuterol **AND** [DISCONTINUED] ipratropium, LORazepam, metoprolol tartrate, nicotine, ondansetron **OR** ondansetron (ZOFRAN) IV, QUEtiapine, senna-docusate, sodium chloride flush, sodium chloride flush, sodium chloride flush, sodium phosphate   Infusions:   dextrose 5 % and 0.9 % NaCl     feeding supplement (JEVITY 1.5 CAL/FIBER) 50 mL/hr at 08/26/23 0459    Antimicrobials: Anti-infectives (From admission, onward)    Start     Dose/Rate Route Frequency Ordered Stop   08/11/23 1000  azithromycin (ZITHROMAX) tablet 250 mg        250 mg  Oral Daily 08/11/23 0852 08/13/23 1006   08/10/23 1700  azithromycin (ZITHROMAX) 250 mg in dextrose 5 % 125 mL IVPB  Status:  Discontinued        250 mg 127.5 mL/hr over 60 Minutes Intravenous Every 24 hours 08/09/23 1759 08/11/23 0852   08/10/23 1000  cefTRIAXone (ROCEPHIN) 1 g in sodium chloride 0.9 % 100 mL IVPB        1 g 200 mL/hr over 30 Minutes Intravenous Every 24 hours 08/09/23 1759 08/13/23 1043   08/09/23 1515  cefTRIAXone (ROCEPHIN) 1 g in sodium chloride 0.9 % 100 mL IVPB        1 g 200 mL/hr over 30 Minutes Intravenous  Once 08/09/23 1504 08/09/23 1622   08/09/23 1515  azithromycin (ZITHROMAX) 500 mg in sodium chloride 0.9 % 250 mL IVPB        500 mg 250 mL/hr over 60 Minutes Intravenous  Once 08/09/23 1504 08/09/23 1803       Objective: Vitals:   08/26/23 0826 08/26/23 0932  BP:  103/63  Pulse:    Resp:    Temp:  99.3 F (37.4 C)  SpO2: 92%     Intake/Output Summary (Last 24 hours) at 08/26/2023 1532 Last data filed at 08/26/2023 1400 Gross per 24 hour  Intake 1255.24 ml  Output  800 ml  Net 455.24 ml   Filed Weights   08/22/23 0300 08/25/23 0450 08/26/23 0509  Weight: 54.4 kg 54.4 kg 60 kg   Weight change: 5.6 kg Body mass index is 20.72 kg/m.   Physical Exam: General exam: Pleasant, elderly male.  Thin built.  Curled up  Skin: No rashes, lesions or ulcers.  Looks dry overall HEENT: Atraumatic, normocephalic, no obvious bleeding Lungs: Clear to auscultation bilaterally,  CVS: S1, S2, no murmur,   GI/Abd: Soft, nontender, nondistended, bowel sound present,   CNS: Somnolent, moans on sternal rub.  Unable to verbalize concern or have a meaningful conversation Psychiatry: Unable to assess Extremities: No pedal edema, no calf tenderness,   Data Review: I have personally reviewed the laboratory data and studies available.  F/u labs ordered Unresulted Labs (From admission, onward)     Start     Ordered   08/25/23 0500  Basic metabolic panel with GFR   Daily,   R     Question:  Specimen collection method  Answer:  Lab=Lab collect   08/24/23 0808   08/25/23 0500  CBC with Differential/Platelet  Daily,   R     Question:  Specimen collection method  Answer:  Lab=Lab collect   08/24/23 0808   08/24/23 0500  Magnesium  Daily,   R     Question:  Specimen collection method  Answer:  Lab=Lab collect   08/23/23 1351   08/24/23 0500  Phosphorus  Daily,   R     Question:  Specimen collection method  Answer:  Lab=Lab collect   08/23/23 1351   08/16/23 0500  Creatinine, serum  (enoxaparin (LOVENOX)    CrCl >/= 30 ml/min)  Weekly,   R     Comments: while on enoxaparin therapy    08/09/23 1801           Total time spent in review of labs and imaging, patient evaluation, formulation of plan, documentation and communication with family: 45 minutes  Signed, Lorin Glass, MD [facility] 08/26/2023

## 2023-08-26 NOTE — Progress Notes (Signed)
 Verified with doctor Dahal that procardia is okay to crush for tube administration.

## 2023-08-27 DIAGNOSIS — Z66 Do not resuscitate: Secondary | ICD-10-CM | POA: Diagnosis not present

## 2023-08-27 DIAGNOSIS — Z7189 Other specified counseling: Secondary | ICD-10-CM | POA: Diagnosis not present

## 2023-08-27 DIAGNOSIS — Z789 Other specified health status: Secondary | ICD-10-CM

## 2023-08-27 DIAGNOSIS — J9601 Acute respiratory failure with hypoxia: Secondary | ICD-10-CM | POA: Diagnosis not present

## 2023-08-27 DIAGNOSIS — Z515 Encounter for palliative care: Secondary | ICD-10-CM | POA: Diagnosis not present

## 2023-08-27 MED ORDER — MORPHINE 100MG IN NS 100ML (1MG/ML) PREMIX INFUSION
1.0000 mg/h | INTRAVENOUS | Status: DC
  Administered 2023-08-27: 1 mg/h via INTRAVENOUS
  Filled 2023-08-27: qty 100

## 2023-08-27 MED ORDER — MORPHINE BOLUS VIA INFUSION
1.0000 mg | INTRAVENOUS | Status: DC | PRN
  Administered 2023-08-27: 4 mg via INTRAVENOUS
  Administered 2023-08-27: 2 mg via INTRAVENOUS

## 2023-08-28 NOTE — Progress Notes (Signed)
 72.63mL of Morphine in NS wasted in Stericycle. Witnessed by Myrla Halsted, RN.

## 2023-09-21 NOTE — Progress Notes (Signed)
 PROGRESS NOTE  Francisco Garcia  DOB: [birth year]  PCP: [name], MD GNF:[id number]  DOA: 08/09/2023  LOS: 18 days  Hospital Day: 19  Brief narrative: Francisco Garcia is a 73 y.o. male with PMH significant for HTN, CAD/CABG, paroxysmal A-fib, TIA, carotid artery stenosis, PAD, COPD, memory impairment, bladder cancer. 3/19, patient presented to the ED with a fall, altered mental status  No in the ED, he was short of breath, had diffuse bilateral rhonchi Workup showed lactic acidosis of 3, CK level elevated 750 Chest x-ray revealed new heterogenous opacity overlying the medial aspect of the right upper lung zone concerning for consolidation.  CT head C-spine revealed no CT evidence of acute intracranial abnormality, no acute fracture or traumatic malalignment of C-spine.   Interlobular septal thickening which could reflect pulmonary edema, emphysema.   Left shoulder x-ray showed chronic longstanding rotator cuff tear, mild glenohumeral osteoarthritis.  MRI brain revealed no acute intracranial abnormality.  Age-related simple atrophy with mild chronic small vessel ischemic disease reported.   Right upper quadrant ultrasound showed subtle liver surface nodularity concerning for cirrhosis, no discrete focal liver lesion.  Small amount of gallbladder sludge  Admitted to [facility]  Patient was treated for pneumonia, rhabdomyolysis. Over the next several days, patient's mental status worsened.  He was monitored for several days, extensively worked up to find out the exact cause.  Medications were adjusted and optimized. His mental status however continued to worsen.  Palliative care was consulted.  After multiple discussions, family made a decision on 4/5, to make the patient comfort care. Currently comfort care order sets in use.  Subjective: Patient was seen and examined this morning. Sleeping, tachypneic.  Moans on touch.  No family at bedside. Seen by palliative later this morning.  Noted  switch from morphine as needed to morphine drip  Assessment and plan: Comfort care status Initially admitted for pneumonia, rhabdomyolysis. Over the next several days, patient's mental status worsened.  He was monitored for several days, extensively worked up to find out the exact cause.  Medications were adjusted and optimized. His mental status however continued to worsen.  Palliative care was consulted.  After multiple discussions, family made a decision on 4/5, to make the patient comfort care. Currently comfort care order sets in use. Residential hospice placement recommended  Other medical issues Acute metabolic encephalopathy Underlying dementia Hypernatremia Acute respiratory failure with hypoxia Community-acquired pneumonia COPD exacerbation Acute systolic CHF AKI Severe malnutrition Acute urinary retention   Goals of care   Code Status: Do not attempt resuscitation (DNR) - Comfort care    DVT prophylaxis:  SCDs Start: 08/09/23 1801   Antimicrobials: Completed the course Consultants: Palliative care  Family Communication: Family not at bedside today.  Status: Inpatient  Patient is from: Home Needs to continue in-hospital care: Pending residential hospice placement   Diet:  Diet Order             Diet NPO time specified  Diet effective now                   Scheduled Meds:  antiseptic oral rinse  15 mL Topical BID   arformoterol  15 mcg Nebulization BID   budesonide (PULMICORT) nebulizer solution  0.5 mg Nebulization BID   guaiFENesin  15 mL Per Tube Q6H   sodium chloride flush  3 mL Intravenous Q12H    PRN meds: acetaminophen **OR** acetaminophen, bisacodyl, glycopyrrolate **OR** glycopyrrolate **OR** glycopyrrolate, guaiFENesin, haloperidol **OR** haloperidol **OR** haloperidol lactate, levalbuterol **AND** [DISCONTINUED]  ipratropium, LORazepam, morphine, morphine CONCENTRATE **OR** [DISCONTINUED]  morphine injection, nicotine, ondansetron  **OR** ondansetron (ZOFRAN) IV, polyvinyl alcohol, senna-docusate, sodium chloride flush, sodium phosphate   Infusions:   morphine 1 mg/hr (09/14/2023 1139)    Antimicrobials: Anti-infectives (From admission, onward)    Start     Dose/Rate Route Frequency Ordered Stop   08/11/23 1000  azithromycin (ZITHROMAX) tablet 250 mg        250 mg Oral Daily 08/11/23 0852 08/13/23 1006   08/10/23 1700  azithromycin (ZITHROMAX) 250 mg in dextrose 5 % 125 mL IVPB  Status:  Discontinued        250 mg 127.5 mL/hr over 60 Minutes Intravenous Every 24 hours 08/09/23 1759 08/11/23 0852   08/10/23 1000  cefTRIAXone (ROCEPHIN) 1 g in sodium chloride 0.9 % 100 mL IVPB        1 g 200 mL/hr over 30 Minutes Intravenous Every 24 hours 08/09/23 1759 08/13/23 1043   08/09/23 1515  cefTRIAXone (ROCEPHIN) 1 g in sodium chloride 0.9 % 100 mL IVPB        1 g 200 mL/hr over 30 Minutes Intravenous  Once 08/09/23 1504 08/09/23 1622   08/09/23 1515  azithromycin (ZITHROMAX) 500 mg in sodium chloride 0.9 % 250 mL IVPB        500 mg 250 mL/hr over 60 Minutes Intravenous  Once 08/09/23 1504 08/09/23 1803       Objective: Vitals:   08/23/2023 0804 09/13/2023 0834  BP:  (!) 75/58  Pulse:  (!) 103  Resp:    Temp:    SpO2: 92% (!) 87%    Intake/Output Summary (Last 24 hours) at 09/03/2023 1424 Last data filed at 08/26/2023 2340 Gross per 24 hour  Intake --  Output 550 ml  Net -550 ml   Filed Weights   08/22/23 0300 08/25/23 0450 08/26/23 0509  Weight: 54.4 kg 54.4 kg 60 kg   Weight change:  Body mass index is 20.72 kg/m.   Physical Exam: General exam: Pleasant, elderly male.  Thin built.  Mild tachypnea present.  Not in pain I did not do a detailed examination because of comfort care status  Data Review: I have personally reviewed the laboratory data and studies available.  F/u labs ordered Unresulted Labs (From admission, onward)    None      Total time spent in review of labs and imaging, patient  evaluation, formulation of plan, documentation and communication with family: 25 minutes  Signed, Lorin Glass, MD [facility] 09/10/2023

## 2023-09-21 NOTE — Progress Notes (Signed)
 Daily Progress Note   Patient Name: Francisco Garcia       Date: 08/31/2023 DOB: 16-Dec-1950  Age: 73 y.o. MRN#: [id number] Attending Physician: Francisco Glass, MD Primary Care Physician: Francisco Damme, MD Admit Date: 08/09/2023 Length of Stay: 18 days  Reason for Consultation/Follow-up: Establishing goals of care, Non pain symptom management, Pain control, and Terminal Care  HPI/Patient Profile:  73 y.o. male  with past medical history of COPD, CAD status post CABG, HTN, PAD, MI, bladder cancer, paroxysmal A-fib, TIA, carotid artery stenosis, memory impairment admitted on 08/09/2023 with a fall/syncope, altered mental status.    Workup in the ED showed creatinine was slightly elevated, had lactic acidosis and rhabdomyolysis. Chest x-ray revealed new heterogenous opacity overlying the medial aspect of the right upper lung zone concerning for consolidation. He was admitted for acute hypoxic respiratory failure with hypoxia secondary to community-acquired pneumonia and COPD exacerbation, AKI, also found to have acute systolic CHF with EF now 30-25% (was 60% in 2023). He has had agitation, sundowning, and issues with pain during this hospitalization.   PMT has been consulted to assist with goals of care conversation.  Subjective:   Subjective: Chart Reviewed. Updates received. Patient Assessed. Created space and opportunity for patient  and family to explore thoughts and feelings regarding current medical situation.  Today's Discussion: Today saw the patient at bedside, no family was present.  When I entered the room the patient appeared to be resting comfortably without objective sign of pain or distress.  However, he is quite tachypneic and breathing at 26 to 28 breaths/min.  Reviewed medication administration record and the patient has received 2 mg of morphine at 630 this morning and again at 830 this morning.  He is now less than 2 hours later and he is quite tachypneic as noted above.  After  seeing the patient I discussed with the nurse.  Based on our discussion I will stop as needed injection morphine and add a morphine drip with bolus via infusion due to increasing needs.  I ensured the nurse know to contact me for any further symptom management needs and palliative medicine will continue to follow.  Afterward I called the patient's daughter and discussed initiation of a morphine drip with boluses to help with dyspnea.  I reassured her there is no signs of obvious pain or distress otherwise.  She asked about adding oxygen and I explained that typically oxygen is not as effective with dyspnea as opioid.  Explained that opioid has been while trying to help with dyspnea and any perceived shortness of breath as well as increased work of breathing and tachypnea.  I ensured she had our contact information for any further questions or concerns.  No needs at this time otherwise.  I provided emotional and general support through therapeutic listening, empathy, sharing of stories, and other techniques. I answered all questions and addressed all concerns to the best of my ability.  Review of Systems  Unable to perform ROS   Objective:   Vital Signs:  BP (!) 75/58 (BP Location: Right Arm)   Pulse (!) 103   Temp 99.3 F (37.4 C) (Axillary)   Resp 16   Ht 5\' 7"  (1.702 m)   Wt 60 kg   SpO2 (!) 87%   BMI 20.72 kg/m   Physical Exam Vitals and nursing note reviewed.  Constitutional:      General: He is sleeping. He is not in acute distress. Cardiovascular:     Rate and Rhythm: Tachycardia  present.  Pulmonary:     Effort: Tachypnea (RR 26-28) present.     Comments: Increased work of breathing Abdominal:     General: Abdomen is flat.     Palpations: Abdomen is soft.  Skin:    General: Skin is warm and dry.     Palliative Assessment/Data: 10%    Existing Vynca/ACP Documentation: None  Assessment & Plan:   Impression: Present on Admission:  CAP (community acquired  pneumonia)  Fall  Acute metabolic encephalopathy  Acute respiratory failure with hypoxia (HCC)  COPD with acute exacerbation (HCC)  Lactic acidosis  Hypertensive urgency  SUMMARY OF RECOMMENDATIONS   DNR-comfort Continue comfort care See symptom management orders below Palliative medicine will continue to see for symptom management  Symptom Management:  STOP Morphine injection 1-4 mg q 3 hours prn ADDED Morphine gtt 1 to 10 mg IV titrate per instructions ADDED Morphine bolus via infusion 1 to 4 mg IV every 15 minutes as needed symptoms of pain, dyspnea tachypnea, general distress Tylenol 650 mg PR every 6 hours as needed mild pain or fever Robinul 0.2 mg IV every 4 hours as needed excessive secretions Haldol 0.5 mg IV every 4 hours as needed agitation or delirium Ativan 0.5 mg IV every 6 hours as needed anxiety Zofran 4 mg IV every 6 hours as needed nausea or vomiting Polyvinyl alcohol 1.4% ophthalmic 1 drop OU 4 times daily as needed dry eyes  Code Status: DNR-comfort  Prognosis: < 2 weeks  Discharge Planning: Hospice facility  Discussed with: Patient's family, medical team, nursing team  Thank you for allowing [place] to participate in the care of Francisco Garcia PMT will continue to support holistically.  Time Total: 51 min  Detailed review of medical records (labs, imaging, vital signs), medically appropriate exam, discussed with treatment team, counseling and education to patient, family, & staff, documenting clinical information, medication management, coordination of care  Francisco Dust, NP Palliative Medicine Team  Team Phone # [phone] (Nights/Weekends)  01/19/2021, 8:17 AM

## 2023-09-21 NOTE — Death Summary Note (Signed)
 DEATH SUMMARY   Patient Details  Name: Francisco Garcia MRN: [id number] DOB: [birth year] WUJ:[id number], Francisco Compton, MD Admission/Discharge Information   Admit Date:  August 19, 2023  Date of Death: Date of Death: 09-06-2023  Time of Death: Time of Death: 2311/09/14  Length of Stay: 09-19-2023   Principle Cause of death: Pneumonia of the right upper lobe  Hospital Diagnoses: Principal Problem:   Acute respiratory failure with hypoxia (HCC) Active Problems:   CAP (community acquired pneumonia)   Fall   Acute metabolic encephalopathy   COPD with acute exacerbation (HCC)   Lactic acidosis   Acute CHF (congestive heart failure) (HCC)   Hypertensive urgency   Protein-calorie malnutrition, severe   Hospital Course: Francisco Garcia was a 73 y.o. male with PMH significant for HTN, CAD/CABG, paroxysmal A-fib, TIA, carotid artery stenosis, PAD, COPD, memory impairment, bladder cancer. 08/19/2023, patient presented to the ED with a fall, altered mental status   In the ED, he was short of breath, had diffuse bilateral rhonchi Workup showed lactic acidosis of 3, CK level elevated 750 Chest x-ray revealed new heterogenous opacity overlying the medial aspect of the right upper lung zone concerning for consolidation.  CT head C-spine revealed no CT evidence of acute intracranial abnormality, no acute fracture or traumatic malalignment of C-spine.   Interlobular septal thickening which could reflect pulmonary edema, emphysema.   Left shoulder x-ray showed chronic longstanding rotator cuff tear, mild glenohumeral osteoarthritis.  MRI brain revealed no acute intracranial abnormality.  Age-related simple atrophy with mild chronic small vessel ischemic disease reported.   Right upper quadrant ultrasound showed subtle liver surface nodularity concerning for cirrhosis, no discrete focal liver lesion.  Small amount of gallbladder sludge  Admitted to [facility]   Patient was treated for pneumonia, rhabdomyolysis. Over the next several  days, patient's mental status worsened.  He was monitored for several days, extensively worked up to find out the exact cause.  Medications were adjusted and optimized. His mental status however continued to worsen.  Palliative care was consulted.  After multiple discussions, family made a decision on 4/5, to make the patient comfort care. Comfort care order set was used.  Patient expired in the hospital on 09/06/2023, at 23:13 hrs.       Consultations: Palliative care  The results of significant diagnostics from this hospitalization (including imaging, microbiology, ancillary and laboratory) are listed below for reference.   Significant Diagnostic Studies: DG CHEST PORT 1 VIEW Result Date: 08/21/2023 CLINICAL DATA:  Cough.  Respiratory failure. EXAM: PORTABLE CHEST 1 VIEW COMPARISON:  19-Aug-2023 FINDINGS: Marked leftward patient rotation. Right suprahilar lesion seen on the previous study is not well demonstrated on the current exam and may well be obscured by the spine on this rotated film. The cardio pericardial silhouette is enlarged. Interstitial markings are diffusely coarsened with chronic features. Bones are diffusely demineralized. IMPRESSION: 1. Marked leftward patient rotation. 2. Right suprahilar lesion seen on the previous study is not well demonstrated on the current exam and may well be obscured by the spine on this rotated film. Consider repeat chest x-ray with less rotation. 3. Chronic interstitial coarsening. Electronically Signed   By: Kennith Center M.D.   On: 08/21/2023 11:25   DG Swallowing Func-Speech Pathology Result Date: 08/16/2023 Modified Barium Swallow Study Patient Details Name: Francisco Garcia MRN: [id number] Date of Birth: 1950/07/19 Today's Date: 08/16/2023 HPI/PMH: HPI: Patient is a 73 y.o. 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 He presented  to the hospital on 08/09/23 for evaluation following fall/syncope,  Barrett's esophagus, GERD. SLP swallow evaluation ordered after RN observed him to get choked and turn purple after a bite of food and sip of juice. Per RD, patient reportedly did well with eating previous date. Clinical Impression: Clinical Impression: Patient presents with an oropharyngeal dysphagia as per this MBS. During oral phase, anterior to posterior transit of liquids and solids was delayed, but with puree and mechanical soft solids being moderately delayed. Mastication of soft solid was significantly prolonged, followed by prolonged transit of bolus. Laryngeal elevation, anterior hyoid excursion and epiglottic inversion all partial in completion. Laryngeal vestibule closure was incomplete. Penetration that did not clear the laryngeal vestibule (PAS 3) occured with thin and nectar thick liquids but aspiration after the swallow from prior penetration only occured with thin liquids (PAS 5, 8). Pharyngeal residuals remained after swallows with liquids and solids, with mild-moderate amount of residuals remaining in vallecular sinus, and mild on posterior pharyngeal wall and pyriform sinus. DIGEST Swallow Severity Rating*  Safety: 2  Efficiency: 1  Overall Pharyngeal Swallow Severity: 2 1: mild; 2: moderate; 3: severe; 4: profound *The Dynamic Imaging Grade of Swallowing Toxicity is standardized for the head and neck cancer population, however, demonstrates promising clinical applications across populations to standardize the clinical rating of pharyngeal swallow safety and severity. Factors that may increase risk of adverse event in presence of aspiration [name] [employer] [name] 2021): Factors that may increase risk of adverse event in presence of aspiration [name] & [name] 2021): Poor general health and/or compromised immunity; Reduced cognitive function; Weak cough; Frail or deconditioned Recommendations/Plan: Swallowing Evaluation Recommendations Swallowing Evaluation Recommendations Recommendations: PO diet PO  Diet Recommendation: Dysphagia 1 (Pureed); Mildly thick liquids (Level 2, nectar thick) Liquid Administration via: Cup; Straw Medication Administration: Crushed with puree Supervision: Full assist for feeding; Full supervision/cueing for swallowing strategies Swallowing strategies  : Small bites/sips; Slow rate; Check for pocketing or oral holding Postural changes: Position pt fully upright for meals Oral care recommendations: Oral care BID (2x/day) Treatment Plan Treatment Plan Treatment recommendations: Therapy as outlined in treatment plan below Follow-up recommendations: Skilled nursing-short term rehab (<3 hours/day) Functional status assessment: Patient has had a recent decline in their functional status and demonstrates the ability to make significant improvements in function in a reasonable and predictable amount of time. Treatment frequency: Min 2x/week Treatment duration: 1 week Interventions: Trials of upgraded texture/liquids; Aspiration precaution training; Diet toleration management by SLP; Patient/family education; Compensatory techniques Recommendations Recommendations for follow up therapy are one component of a multi-disciplinary discharge planning process, led by the attending physician.  Recommendations may be updated based on patient status, additional functional criteria and insurance authorization. Assessment: Orofacial Exam: Orofacial Exam Oral Cavity: Oral Hygiene: WFL Oral Cavity - Dentition: Edentulous Orofacial Anatomy: WFL Anatomy: Anatomy: WFL Boluses Administered: Boluses Administered Boluses Administered: Thin liquids (Level 0); Mildly thick liquids (Level 2, nectar thick); Moderately thick liquids (Level 3, honey thick); Solid; Puree  Oral Impairment Domain: Oral Impairment Domain Lip Closure: No labial escape Tongue control during bolus hold: Not tested Bolus preparation/mastication: Slow prolonged chewing/mashing with complete recollection Bolus transport/lingual motion: Slow  tongue motion Oral residue: Residue collection on oral structures Location of oral residue : Floor of mouth; Tongue Initiation of pharyngeal swallow : Valleculae; Pyriform sinuses  Pharyngeal Impairment Domain: Pharyngeal Impairment Domain Soft palate elevation: No bolus between soft palate (SP)/pharyngeal wall (PW) Laryngeal elevation: Partial superior movement of thyroid cartilage/partial approximation of arytenoids to epiglottic petiole Anterior  hyoid excursion: Partial anterior movement Epiglottic movement: Partial inversion Laryngeal vestibule closure: Incomplete, narrow column air/contrast in laryngeal vestibule Pharyngeal stripping wave : Present - diminished Pharyngeal contraction (A/P view only): N/A Pharyngoesophageal segment opening: Complete distension and complete duration, no obstruction of flow Tongue base retraction: No contrast between tongue base and posterior pharyngeal wall (PPW) Pharyngeal residue: Collection of residue within or on pharyngeal structures Location of pharyngeal residue: Valleculae; Pharyngeal wall; Pyriform sinuses; Tongue base  Esophageal Impairment Domain: Esophageal Impairment Domain Esophageal clearance upright position: Complete clearance, esophageal coating Pill: No data recorded Penetration/Aspiration Scale Score: Penetration/Aspiration Scale Score 1.  Material does not enter airway: Moderately thick liquids (Level 3, honey thick); Puree 2.  Material enters airway, remains ABOVE vocal cords then ejected out: Mildly thick liquids (Level 2, nectar thick) 3.  Material enters airway, remains ABOVE vocal cords and not ejected out: Thin liquids (Level 0); Mildly thick liquids (Level 2, nectar thick) 5.  Material enters airway, CONTACTS cords and not ejected out: Thin liquids (Level 0) 8.  Material enters airway, passes BELOW cords without attempt by patient to eject out (silent aspiration) : Thin liquids (Level 0) Compensatory Strategies: Compensatory Strategies Compensatory  strategies: Yes   General Information: No data recorded Diet Prior to this Study: Dysphagia 1 (pureed); Thin liquids (Level 0)   No data recorded  Respiratory Status: WFL   Supplemental O2: None (Room air)   History of Recent Intubation: No  Behavior/Cognition: Alert; Cooperative; Pleasant mood; Confused Self-Feeding Abilities: Dependent for feeding Baseline vocal quality/speech: Normal Volitional Cough: Able to elicit Volitional Swallow: Able to elicit Exam Limitations: Other (comment) (poor positioning was remedied by Rad tech holding patient's head upright) Goal Planning: Prognosis for improved oropharyngeal function: Good Barriers to Reach Goals: Cognitive deficits No data recorded Patient/Family Stated Goal: patient unable to state, no family present Consulted and agree with results and recommendations: Pt unable/family or caregiver not available Pain: Pain Assessment Pain Assessment: Faces Pain Score: 0 Faces Pain Scale: 2 Breathing: 0 Negative Vocalization: 1 Facial Expression: 0 Body Language: 1 Consolability: 1 PAINAD Score: 3 Pain Location: feet Pain Descriptors / Indicators: Discomfort; Grimacing Pain Intervention(s): Limited activity within patient's tolerance End of Session: Start Time:SLP Start Time (ACUTE ONLY): 1400 Stop Time: SLP Stop Time (ACUTE ONLY): 1420 Time Calculation:SLP Time Calculation (min) (ACUTE ONLY): 20 min Charges: SLP Evaluations $ SLP Speech Visit: 1 Visit SLP Evaluations $BSS Swallow: 1 Procedure $MBS Swallow: 1 Procedure SLP visit diagnosis: SLP Visit Diagnosis: Dysphagia, oropharyngeal phase (R13.12) Past Medical History: No past medical history on file. Past Surgical History: No past surgical history on file. Angela Nevin, MA, CCC-SLP Speech Therapy  [place] EKG SITE RITE Result Date: 08/15/2023 If [facility] image not attached, placement could not be confirmed due to current cardiac rhythm.  ECHOCARDIOGRAM COMPLETE Result Date: 08/11/2023    ECHOCARDIOGRAM REPORT   Patient  Name:   Francisco Garcia Date of Exam: 08/11/2023 Medical Rec #:  [id number]        Height:       67.0 in Accession #:    [id number]       Weight:       147.0 lb Date of Birth:  01/19/1951        BSA:          1.774 m Patient Age:    73 years         BP:           168/112 mmHg Patient Gender: M  HR:           87 bpm. Exam Location:  Inpatient Procedure: 2D Echo, Cardiac Doppler and Color Doppler (Both Spectral and Color            Flow Doppler were utilized during procedure). Indications:    CHF I50.9  History:        Patient has no prior history of Echocardiogram examinations.                 CHF, COPD; Risk Factors:Hypertension.  Sonographer:    Lucendia Herrlich RCS Referring Phys: [phone] RIPUDEEP K RAI  Sonographer Comments: Image acquisition challenging due to uncooperative patient. IMPRESSIONS  1. Left ventricular ejection fraction, by estimation, is 30 to 35%. Left ventricular ejection fraction by 2D MOD biplane is 30.7 %. The left ventricle has moderately decreased function. The left ventricle demonstrates global hypokinesis with more severe  inferior hypokinesis to akinesis. There is moderate left ventricular hypertrophy. Left ventricular diastolic parameters are indeterminate.  2. Right ventricular systolic function is moderately reduced. The right ventricular size is mildly enlarged. There is moderately elevated pulmonary artery systolic pressure. The estimated right ventricular systolic pressure is 45.0 mmHg.  3. Left atrial size was mildly dilated.  4. Right atrial size was severely dilated.  5. The mitral valve is abnormal. Mild mitral valve regurgitation.  6. The tricuspid valve is abnormal. Tricuspid valve regurgitation is severe.  7. The aortic valve is tricuspid. Aortic valve regurgitation is not visualized. Aortic valve sclerosis is present, with no evidence of aortic valve stenosis.  8. The inferior vena cava is dilated in size with <50% respiratory variability, suggesting right atrial  pressure of 15 mmHg. Comparison(s): No prior Echocardiogram. FINDINGS  Left Ventricle: Left ventricular ejection fraction, by estimation, is 30 to 35%. Left ventricular ejection fraction by 2D MOD biplane is 30.7 %. The left ventricle has moderately decreased function. The left ventricle demonstrates global hypokinesis. The left ventricular internal cavity size was normal in size. There is moderate left ventricular hypertrophy. Left ventricular diastolic parameters are indeterminate. Right Ventricle: The right ventricular size is mildly enlarged. No increase in right ventricular wall thickness. Right ventricular systolic function is moderately reduced. There is moderately elevated pulmonary artery systolic pressure. The tricuspid regurgitant velocity is 2.74 m/s, and with an assumed right atrial pressure of 15 mmHg, the estimated right ventricular systolic pressure is 45.0 mmHg. Left Atrium: Left atrial size was mildly dilated. Right Atrium: Right atrial size was severely dilated. Pericardium: There is no evidence of pericardial effusion. Mitral Valve: The mitral valve is abnormal. Mild mitral valve regurgitation. Tricuspid Valve: The tricuspid valve is abnormal. Tricuspid valve regurgitation is severe. The flow in the hepatic veins is reversed during ventricular systole. Aortic Valve: The aortic valve is tricuspid. Aortic valve regurgitation is not visualized. Aortic valve sclerosis is present, with no evidence of aortic valve stenosis. Aortic valve peak gradient measures 3.8 mmHg. Pulmonic Valve: The pulmonic valve was normal in structure. Pulmonic valve regurgitation is not visualized. No evidence of pulmonic stenosis. Aorta: The aortic root and ascending aorta are structurally normal, with no evidence of dilitation. Venous: The inferior vena cava is dilated in size with less than 50% respiratory variability, suggesting right atrial pressure of 15 mmHg. IAS/Shunts: No atrial level shunt detected by color flow  Doppler.  LEFT VENTRICLE PLAX 2D                        Biplane EF (MOD) LVIDd:  4.20 cm         LV Biplane EF:   Left LVIDs:         3.90 cm                          ventricular LV PW:         1.20 cm                          ejection LV IVS:        1.30 cm                          fraction by LVOT diam:     2.30 cm                          2D MOD LV SV:         41                               biplane is LV SV Index:   23                               30.7 %. LVOT Area:     4.15 cm                                Diastology                                LV e' medial:    8.67 cm/s LV Volumes (MOD)               LV E/e' medial:  10.0 LV vol d, MOD    63.8 ml       LV e' lateral:   12.70 cm/s A2C:                           LV E/e' lateral: 6.8 LV vol d, MOD    70.4 ml A4C: LV vol s, MOD    49.3 ml A2C: LV vol s, MOD    48.8 ml A4C: LV SV MOD A2C:   14.5 ml LV SV MOD A4C:   70.4 ml LV SV MOD BP:    21.7 ml RIGHT VENTRICLE            IVC RV S prime:     6.83 cm/s  IVC diam: 2.70 cm TAPSE (M-mode): 1.4 cm LEFT ATRIUM             Index        RIGHT ATRIUM           Index LA diam:        4.70 cm 2.65 cm/m   RA Area:     29.10 cm LA Vol (A2C):   64.9 ml 36.57 ml/m  RA Volume:   100.00 ml 56.36 ml/m LA Vol (A4C):   59.0 ml 33.25 ml/m LA Biplane Vol: 63.6 ml 35.84 ml/m  AORTIC VALVE AV Area (Vmax): 3.06 cm AV Vmax:        97.10 cm/s AV Peak Grad:  3.8 mmHg LVOT Vmax:      71.43 cm/s LVOT Vmean:     46.100 cm/s LVOT VTI:       0.099 m  AORTA Ao Root diam: 3.10 cm Ao Asc diam:  3.40 cm MITRAL VALVE               TRICUSPID VALVE MV Area (PHT): 4.57 cm    TR Peak grad:   30.0 mmHg MV Decel Time: 166 msec    TR Vmax:        274.00 cm/s MR Peak grad: 16.3 mmHg MR Vmax:      202.00 cm/s  SHUNTS MV E velocity: 86.50 cm/s  Systemic VTI:  0.10 m MV A velocity: 49.80 cm/s  Systemic Diam: 2.30 cm MV E/A ratio:  1.74 Zoila Shutter MD Electronically signed by Zoila Shutter MD Signature Date/Time: 08/11/2023/12:16:14 PM     Final    MR BRAIN WO CONTRAST Result Date: 08/10/2023 CLINICAL DATA:  Initial evaluation for acute mental status change, unknown cause. EXAM: MRI HEAD WITHOUT CONTRAST TECHNIQUE: Multiplanar, multiecho pulse sequences of the brain and surrounding structures were obtained without intravenous contrast. COMPARISON:  CT from 08/09/2023 FINDINGS: Brain: Examination moderately degraded by motion artifact. Generalized age-related cerebral atrophy. Patchy T2/FLAIR hyperintensity involving the periventricular and deep white matter both cerebral hemispheres as well as the pons, consistent with chronic small vessel ischemic disease, mild for age. No evidence for acute or subacute infarct. Gray-white matter differentiation maintained. No areas of chronic cortical infarction. No acute or chronic intracranial blood products. No mass lesion, midline shift or mass effect. No hydrocephalus or extra-axial fluid collection. Pituitary gland within normal limits. Vascular: Loss of normal flow void within the intradural left V4 segment, which could be related to slow flow and/or occlusion, similar as compared to previous MRI from 11/27/2021. Major intracranial vascular flow voids are otherwise maintained. Skull and upper cervical spine: Craniocervical junction within normal limits. Bone marrow signal intensity normal. No scalp soft tissue abnormality. Sinuses/Orbits: Prior bilateral ocular lens replacement. Chronic mucosal thickening noted throughout the paranasal sinuses. Trace left mastoid effusion, of doubtful significance. Other: None. IMPRESSION: 1. No acute intracranial abnormality. 2. Age-related cerebral atrophy with mild chronic small vessel ischemic disease. 3. Loss of normal flow void within the intradural left V4 segment, which could be related to slow flow and/or occlusion, similar as compared to previous MRI from 11/27/2021. Electronically Signed   By: Rise Mu M.D.   On: 08/10/2023 02:54   US Abdomen  Limited RUQ (LIVER/GB) Result Date: 08/09/2023 CLINICAL DATA:  [id number] RUQ abdominal pain [id number]. EXAM: ULTRASOUND ABDOMEN LIMITED RIGHT UPPER QUADRANT COMPARISON:  None Available. FINDINGS: Gallbladder: Physiologically distended. No abnormal wall thickening or pericholecystic free fluid. There is small to moderate amount of sludge. Sonographic Murphy's sign was negative as per the technologist. Common bile duct: Diameter: Up to 4.5 mm.  No intrahepatic bile duct dilation.  None Liver: There is subtle liver surface nodularity, concerning for cirrhosis. There is heterogeneous echotexture. Normal echogenicity. No discrete focal mass seen on the provided images. Portal vein is patent on color Doppler imaging with normal direction of blood flow towards the liver. Other: None. IMPRESSION: 1. Subtle liver surface nodularity, concerning for cirrhosis. No discrete focal liver lesion seen on the provided images. 2. Small to moderate amount of gallbladder sludge without sonographic evidence of acute cholecystitis. Electronically Signed   By: Jules Schick M.D.   On: 08/09/2023 16:19   DG Shoulder Left Result Date: 08/09/2023 CLINICAL  DATA:  Fell, left shoulder pain EXAM: LEFT SHOULDER - 2+ VIEW COMPARISON:  None Available. FINDINGS: Internal rotation, external rotation, transscapular views of the left shoulder are obtained. No acute displaced fracture. There is marked decrease in the acromial humeral interval consistent with chronic longstanding rotator cuff tear. Mild glenohumeral joint osteoarthritis. Soft tissues are unremarkable. Visualized portions of the left chest are clear. IMPRESSION: 1. Narrowing of the acromial humeral interval, consistent with chronic longstanding rotator cuff tear. 2. Mild glenohumeral osteoarthritis. 3. No acute fracture. Electronically Signed   By: Sharlet Salina M.D.   On: 08/09/2023 15:38   CT Head Wo Contrast Result Date: 08/09/2023 CLINICAL DATA:  Head trauma, found down by EMS.  EXAM: CT HEAD WITHOUT CONTRAST CT CERVICAL SPINE WITHOUT CONTRAST TECHNIQUE: Multidetector CT imaging of the head and cervical spine was performed following the standard protocol without intravenous contrast. Multiplanar CT image reconstructions of the cervical spine were also generated. RADIATION DOSE REDUCTION: This exam was performed according to the departmental dose-optimization program which includes automated exposure control, adjustment of the mA and/or kV according to patient size and/or use of iterative reconstruction technique. COMPARISON:  CT head 11/07/2022, CT cervical spine 11/27/2021 FINDINGS: CT HEAD FINDINGS Brain: No acute intracranial hemorrhage. No CT evidence of acute infarct. Nonspecific hypoattenuation in the periventricular and subcortical white matter favored to reflect chronic microvascular ischemic changes. Mild generalized parenchymal volume loss. No edema, mass effect, or midline shift. The basilar cisterns are patent. Ventricles: Prominence of the ventricles suggesting underlying parenchymal volume loss. Vascular: Atherosclerotic calcifications of the carotid siphons and intracranial vertebral arteries. No hyperdense vessel. Skull: No acute or aggressive finding. Orbits: Orbits are symmetric. Sinuses: Moderate mucosal thickening in the ethmoid sinuses with additional scattered mucosal thickening in the sphenoid sinuses and right frontal sinus. Other: Mastoid air cells are clear. CT CERVICAL SPINE FINDINGS Alignment: Cervical lordosis is maintained. No listhesis. No facet subluxation or dislocation. Skull base and vertebrae: No compression fracture or displaced fracture in the cervical spine. Soft tissues and spinal canal: No prevertebral fluid or swelling. No visible canal hematoma. Prominent atherosclerotic calcifications at the carotid bifurcations. Disc levels: Intervertebral disc space narrowing greatest at C5-6 and C6-7. Disc bulges at multiple levels. Disc osteophyte complex at  C6-7 results in mild spinal canal stenosis. Upper chest: Interlobular septal thickening which could reflect pulmonary edema. Biapical pleural-parenchymal scarring. Emphysema. Other: None. IMPRESSION: No CT evidence of acute intracranial abnormality. No acute fracture or traumatic malalignment of the cervical spine. Interlobular septal thickening which could reflect pulmonary edema. Emphysema. Electronically Signed   By: Emily Filbert M.D.   On: 08/09/2023 15:20   CT Cervical Spine Wo Contrast Result Date: 08/09/2023 CLINICAL DATA:  Head trauma, found down by EMS. EXAM: CT HEAD WITHOUT CONTRAST CT CERVICAL SPINE WITHOUT CONTRAST TECHNIQUE: Multidetector CT imaging of the head and cervical spine was performed following the standard protocol without intravenous contrast. Multiplanar CT image reconstructions of the cervical spine were also generated. RADIATION DOSE REDUCTION: This exam was performed according to the departmental dose-optimization program which includes automated exposure control, adjustment of the mA and/or kV according to patient size and/or use of iterative reconstruction technique. COMPARISON:  CT head 11/07/2022, CT cervical spine 11/27/2021 FINDINGS: CT HEAD FINDINGS Brain: No acute intracranial hemorrhage. No CT evidence of acute infarct. Nonspecific hypoattenuation in the periventricular and subcortical white matter favored to reflect chronic microvascular ischemic changes. Mild generalized parenchymal volume loss. No edema, mass effect, or midline shift. The basilar cisterns are patent. Ventricles:  Prominence of the ventricles suggesting underlying parenchymal volume loss. Vascular: Atherosclerotic calcifications of the carotid siphons and intracranial vertebral arteries. No hyperdense vessel. Skull: No acute or aggressive finding. Orbits: Orbits are symmetric. Sinuses: Moderate mucosal thickening in the ethmoid sinuses with additional scattered mucosal thickening in the sphenoid sinuses and  right frontal sinus. Other: Mastoid air cells are clear. CT CERVICAL SPINE FINDINGS Alignment: Cervical lordosis is maintained. No listhesis. No facet subluxation or dislocation. Skull base and vertebrae: No compression fracture or displaced fracture in the cervical spine. Soft tissues and spinal canal: No prevertebral fluid or swelling. No visible canal hematoma. Prominent atherosclerotic calcifications at the carotid bifurcations. Disc levels: Intervertebral disc space narrowing greatest at C5-6 and C6-7. Disc bulges at multiple levels. Disc osteophyte complex at C6-7 results in mild spinal canal stenosis. Upper chest: Interlobular septal thickening which could reflect pulmonary edema. Biapical pleural-parenchymal scarring. Emphysema. Other: None. IMPRESSION: No CT evidence of acute intracranial abnormality. No acute fracture or traumatic malalignment of the cervical spine. Interlobular septal thickening which could reflect pulmonary edema. Emphysema. Electronically Signed   By: Emily Filbert M.D.   On: 08/09/2023 15:20   DG Pelvis Portable Result Date: 08/09/2023 CLINICAL DATA:  Larey Seat, left hip discomfort EXAM: PORTABLE PELVIS 1-2 VIEWS COMPARISON:  None Available. FINDINGS: Frontal view of the pelvis includes both hips. No fracture, subluxation, or dislocation. Symmetrical bilateral hip osteoarthritis. Extensive atherosclerosis. The sacroiliac joints are normal. IMPRESSION: 1. Bilateral hip osteoarthritis.  No acute fracture. Electronically Signed   By: Sharlet Salina M.D.   On: 08/09/2023 15:01   DG Chest Portable 1 View Result Date: 08/09/2023 CLINICAL DATA:  Fall.  Altered mental status. EXAM: PORTABLE CHEST 1 VIEW COMPARISON:  04/11/2023. FINDINGS: Redemonstration of diffuse increased interstitial markings essentially similar to several prior studies. However, there is new heterogeneous opacity overlying the medial aspect of the right upper lung zone measuring approximately 4 x 4.8 cm, concerning for  consolidation. Correlate clinically to determine the need for further imaging with chest CT scan versus follow-up chest radiograph to document resolution. Bilateral lung fields are otherwise clear. Bilateral costophrenic angles are clear. Stable cardio-mediastinal silhouette. No acute osseous abnormalities. The soft tissues are within normal limits. IMPRESSION: *New heterogeneous opacity overlying the medial aspect of the right upper lung zone, concerning for consolidation. Correlate clinically to determine the need for further imaging with chest CT scan versus follow-up chest radiograph to document resolution. Electronically Signed   By: Jules Schick M.D.   On: 08/09/2023 15:00    Microbiology: No results found for this or any previous visit (from the past 240 hours).  Time spent: 25 minutes  Signed: Lorin Glass, MD 08/28/2023

## 2023-09-21 NOTE — TOC Progression Note (Signed)
 Transition of Care [facility]) - Progression Note    Patient Details  Name: Francisco Garcia MRN: [id number] Date of Birth: 10-12-1950  [facility]) CM/SW Contact  Dellie Burns [place], [place] Phone Number: 08/23/2023, 9:13 AM  Clinical Narrative:  Per Palliative Medicine APP, pt's family has elected comfort measures and requesting hospice home placement. Spoke to pt's dtr Becky who confirmed agreeable to hospice home. Explained placement process and answered questions. Dtr requesting [facility]. Referral made to [facility] with [facility]. Will provide updates as available.   Dellie Burns, MSW, LCSW [phone] (coverage)            Expected Discharge Plan and Services                                               Social Determinants of Health (SDOH) Interventions SDOH Screenings   Food Insecurity: Patient Unable To Answer (08/09/2023)  Housing: Patient Unable To Answer (08/09/2023)  Transportation Needs: Patient Unable To Answer (08/09/2023)  Utilities: Patient Unable To Answer (08/09/2023)  Tobacco Use: Medium Risk (08/09/2023)    Readmission Risk Interventions     No data to display

## 2023-09-21 DEATH — deceased
